# Patient Record
Sex: Female | Born: 1935 | Race: Black or African American | Hispanic: No | Smoking: Never smoker
Health system: Southern US, Community
[De-identification: ages and names within clinical notes are randomized; demographics above are authoritative.]

## PROBLEM LIST (undated history)

## (undated) DIAGNOSIS — K219 Gastro-esophageal reflux disease without esophagitis: Secondary | ICD-10-CM

## (undated) DIAGNOSIS — E785 Hyperlipidemia, unspecified: Secondary | ICD-10-CM

## (undated) DIAGNOSIS — I82409 Acute embolism and thrombosis of unspecified deep veins of unspecified lower extremity: Secondary | ICD-10-CM

## (undated) DIAGNOSIS — D649 Anemia, unspecified: Secondary | ICD-10-CM

## (undated) DIAGNOSIS — R7989 Other specified abnormal findings of blood chemistry: Secondary | ICD-10-CM

## (undated) DIAGNOSIS — F0390 Unspecified dementia without behavioral disturbance: Secondary | ICD-10-CM

## (undated) DIAGNOSIS — F329 Major depressive disorder, single episode, unspecified: Secondary | ICD-10-CM

## (undated) DIAGNOSIS — I10 Essential (primary) hypertension: Secondary | ICD-10-CM

## (undated) DIAGNOSIS — I2510 Atherosclerotic heart disease of native coronary artery without angina pectoris: Secondary | ICD-10-CM

## (undated) DIAGNOSIS — F32A Depression, unspecified: Secondary | ICD-10-CM

## (undated) DIAGNOSIS — Z86718 Personal history of other venous thrombosis and embolism: Secondary | ICD-10-CM

## (undated) HISTORY — DX: Personal history of other venous thrombosis and embolism: Z86.718

## (undated) HISTORY — DX: Other specified abnormal findings of blood chemistry: R79.89

## (undated) HISTORY — DX: Anemia, unspecified: D64.9

## (undated) HISTORY — PX: OTHER SURGICAL HISTORY: SHX169

## (undated) HISTORY — PX: EYE SURGERY: SHX253

---

## 2000-02-05 ENCOUNTER — Ambulatory Visit (HOSPITAL_COMMUNITY): Admission: RE | Admit: 2000-02-05 | Discharge: 2000-02-05 | Payer: Self-pay | Admitting: General Practice

## 2000-02-05 ENCOUNTER — Encounter: Payer: Self-pay | Admitting: General Practice

## 2000-05-20 ENCOUNTER — Other Ambulatory Visit: Admission: RE | Admit: 2000-05-20 | Discharge: 2000-05-20 | Payer: Self-pay | Admitting: Obstetrics

## 2000-05-29 ENCOUNTER — Emergency Department (HOSPITAL_COMMUNITY): Admission: EM | Admit: 2000-05-29 | Discharge: 2000-05-29 | Payer: Self-pay | Admitting: Emergency Medicine

## 2000-05-29 ENCOUNTER — Encounter: Payer: Self-pay | Admitting: Emergency Medicine

## 2000-06-07 ENCOUNTER — Ambulatory Visit (HOSPITAL_COMMUNITY): Admission: RE | Admit: 2000-06-07 | Discharge: 2000-06-07 | Payer: Self-pay | Admitting: *Deleted

## 2000-06-18 ENCOUNTER — Ambulatory Visit (HOSPITAL_COMMUNITY): Admission: RE | Admit: 2000-06-18 | Discharge: 2000-06-18 | Payer: Self-pay | Admitting: *Deleted

## 2001-04-03 ENCOUNTER — Ambulatory Visit (HOSPITAL_COMMUNITY): Admission: RE | Admit: 2001-04-03 | Discharge: 2001-04-03 | Payer: Self-pay | Admitting: *Deleted

## 2002-01-19 ENCOUNTER — Ambulatory Visit (HOSPITAL_COMMUNITY): Admission: RE | Admit: 2002-01-19 | Discharge: 2002-01-19 | Payer: Self-pay | Admitting: *Deleted

## 2002-04-01 ENCOUNTER — Inpatient Hospital Stay (HOSPITAL_COMMUNITY): Admission: EM | Admit: 2002-04-01 | Discharge: 2002-04-06 | Payer: Self-pay | Admitting: Emergency Medicine

## 2003-01-04 ENCOUNTER — Emergency Department (HOSPITAL_COMMUNITY): Admission: EM | Admit: 2003-01-04 | Discharge: 2003-01-04 | Payer: Self-pay | Admitting: Emergency Medicine

## 2003-01-12 ENCOUNTER — Inpatient Hospital Stay (HOSPITAL_COMMUNITY): Admission: EM | Admit: 2003-01-12 | Discharge: 2003-01-18 | Payer: Self-pay | Admitting: Emergency Medicine

## 2003-01-13 ENCOUNTER — Encounter: Payer: Self-pay | Admitting: Cardiology

## 2003-01-21 ENCOUNTER — Ambulatory Visit (HOSPITAL_COMMUNITY): Admission: RE | Admit: 2003-01-21 | Discharge: 2003-01-21 | Payer: Self-pay | Admitting: Internal Medicine

## 2003-04-30 ENCOUNTER — Ambulatory Visit (HOSPITAL_COMMUNITY): Admission: RE | Admit: 2003-04-30 | Discharge: 2003-04-30 | Payer: Self-pay | Admitting: Internal Medicine

## 2003-05-23 ENCOUNTER — Emergency Department (HOSPITAL_COMMUNITY): Admission: EM | Admit: 2003-05-23 | Discharge: 2003-05-23 | Payer: Self-pay | Admitting: Emergency Medicine

## 2003-05-27 ENCOUNTER — Ambulatory Visit (HOSPITAL_COMMUNITY): Admission: RE | Admit: 2003-05-27 | Discharge: 2003-05-27 | Payer: Self-pay | Admitting: Internal Medicine

## 2005-08-22 IMAGING — CR DG HIP COMPLETE 2+V*R*
3 series · 3 of 3 positions shown · non-contrast
Comparison: none

CLINICAL DATA: Right shoulder pain.  Right hip pain.  
 RIGHT SHOULDER (THREE VIEWS) 04/30/03
 No acute bony abnormality.  Specifically no evidence of fracture, subluxation or dislocation.  The right acromioclavicular joint appears mildly widened.  If there is clinical concern for AC joint separation, dedicated views of both AC joints with and without weighted would be recommended.  
 IMPRESSION
 Mild widening of the right AC joint.  If there is clinical concern for AC joint separation, recommend with and without weight views of both shoulders.  No fracture.  
 RIGHT HIP (THREE VIEWS) 04/30/03
 Two views of the right hip and AP view of the pelvis were performed.  
 No acute bony abnormality.  Specifically no evidence of fracture, subluxation or dislocation.  No bony pelvic abnormality is seen. 
 No acute bony abnormality.

[view not recorded (1 of 3)]
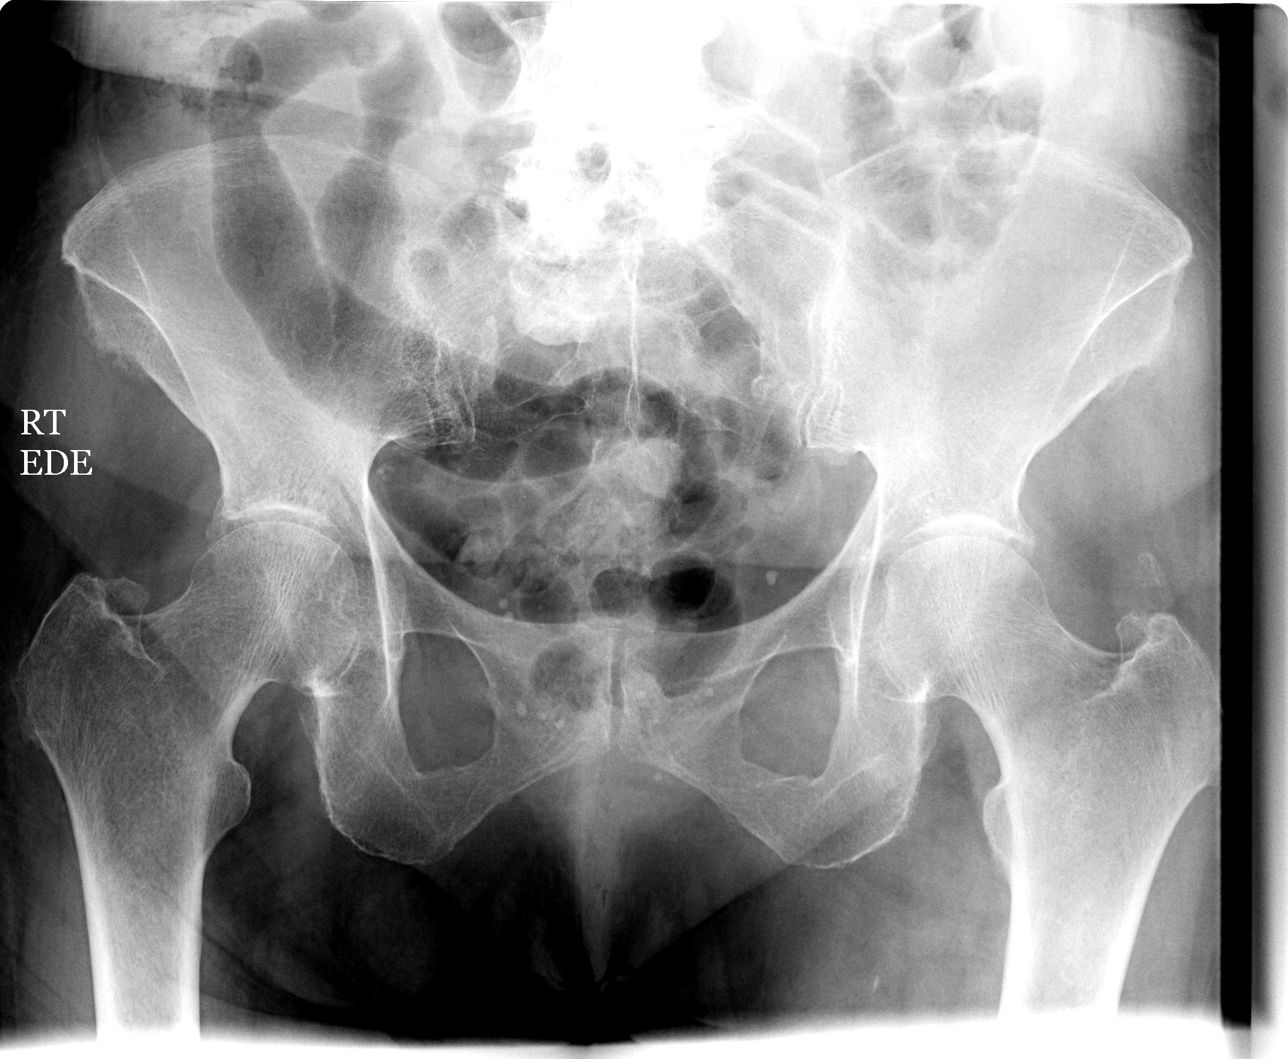

[view not recorded (2 of 3)]
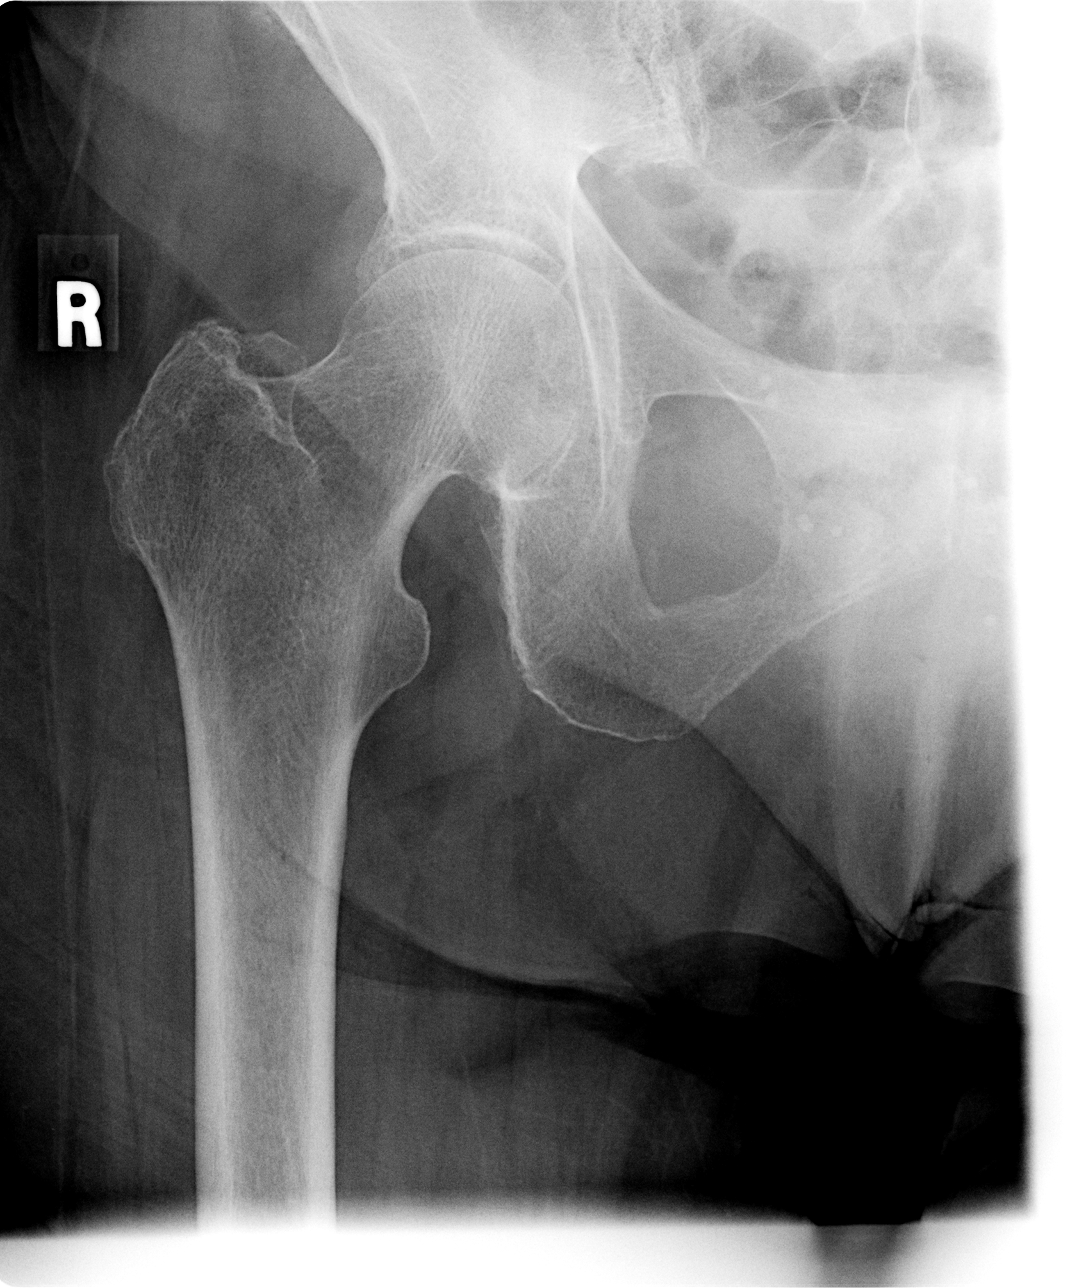

[view not recorded (3 of 3)]
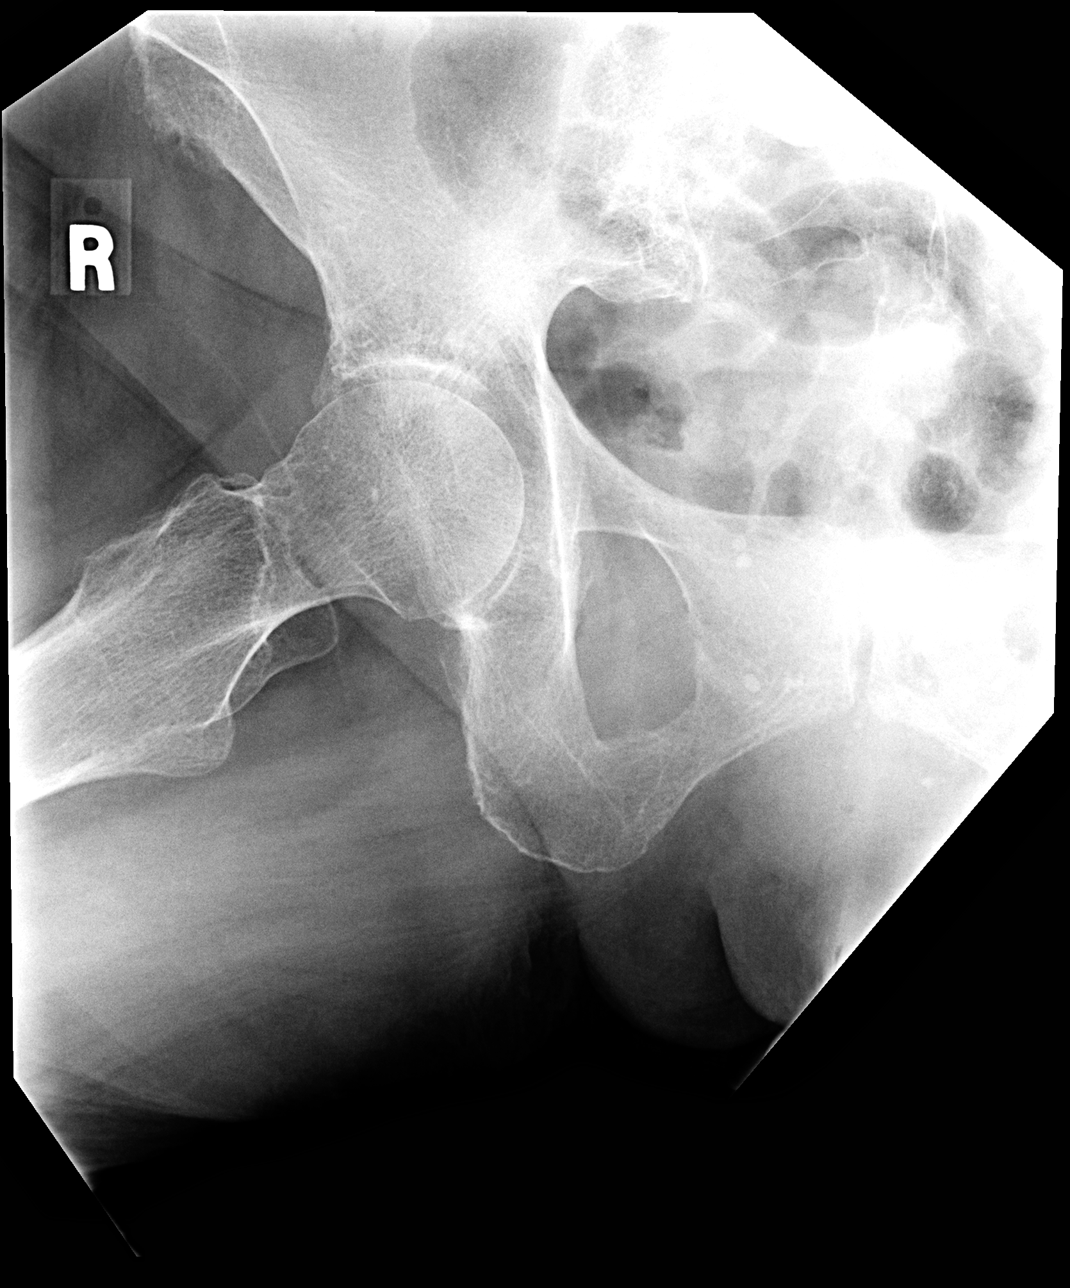

[3 of 3 positions shown; findings below may reference images not displayed]

## 2005-08-22 IMAGING — CR DG SHOULDER 2+V*R*
3 series · 3 of 3 positions shown · non-contrast
Comparison: none

CLINICAL DATA: Right shoulder pain.  Right hip pain.  
 RIGHT SHOULDER (THREE VIEWS) 04/30/03
 No acute bony abnormality.  Specifically no evidence of fracture, subluxation or dislocation.  The right acromioclavicular joint appears mildly widened.  If there is clinical concern for AC joint separation, dedicated views of both AC joints with and without weighted would be recommended.  
 IMPRESSION
 Mild widening of the right AC joint.  If there is clinical concern for AC joint separation, recommend with and without weight views of both shoulders.  No fracture.  
 RIGHT HIP (THREE VIEWS) 04/30/03
 Two views of the right hip and AP view of the pelvis were performed.  
 No acute bony abnormality.  Specifically no evidence of fracture, subluxation or dislocation.  No bony pelvic abnormality is seen. 
 No acute bony abnormality.

[view not recorded (1 of 3)]
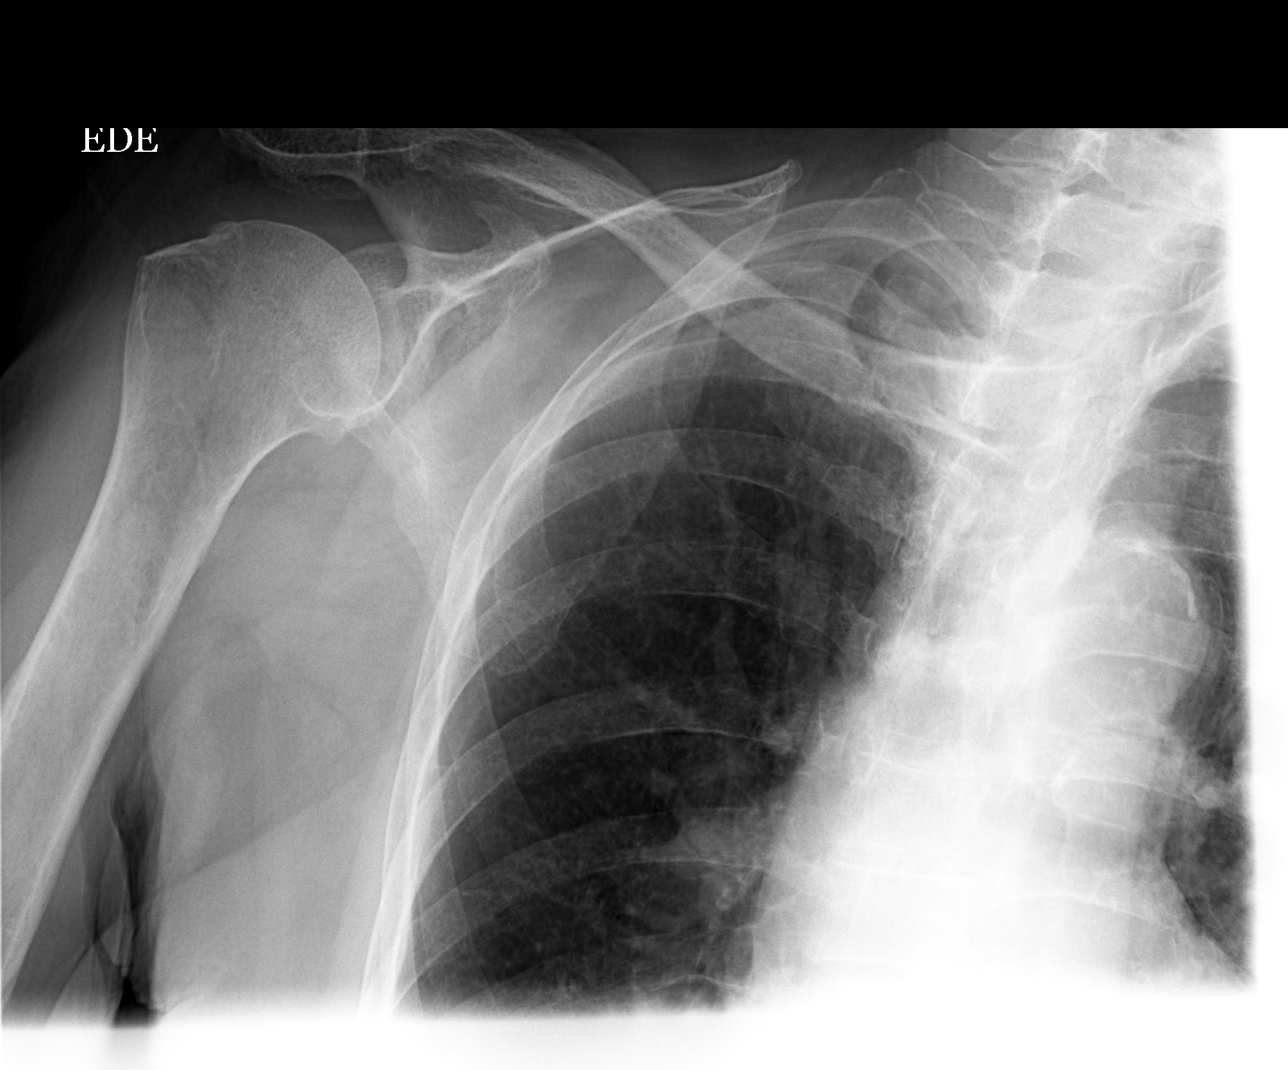

[view not recorded (2 of 3)]
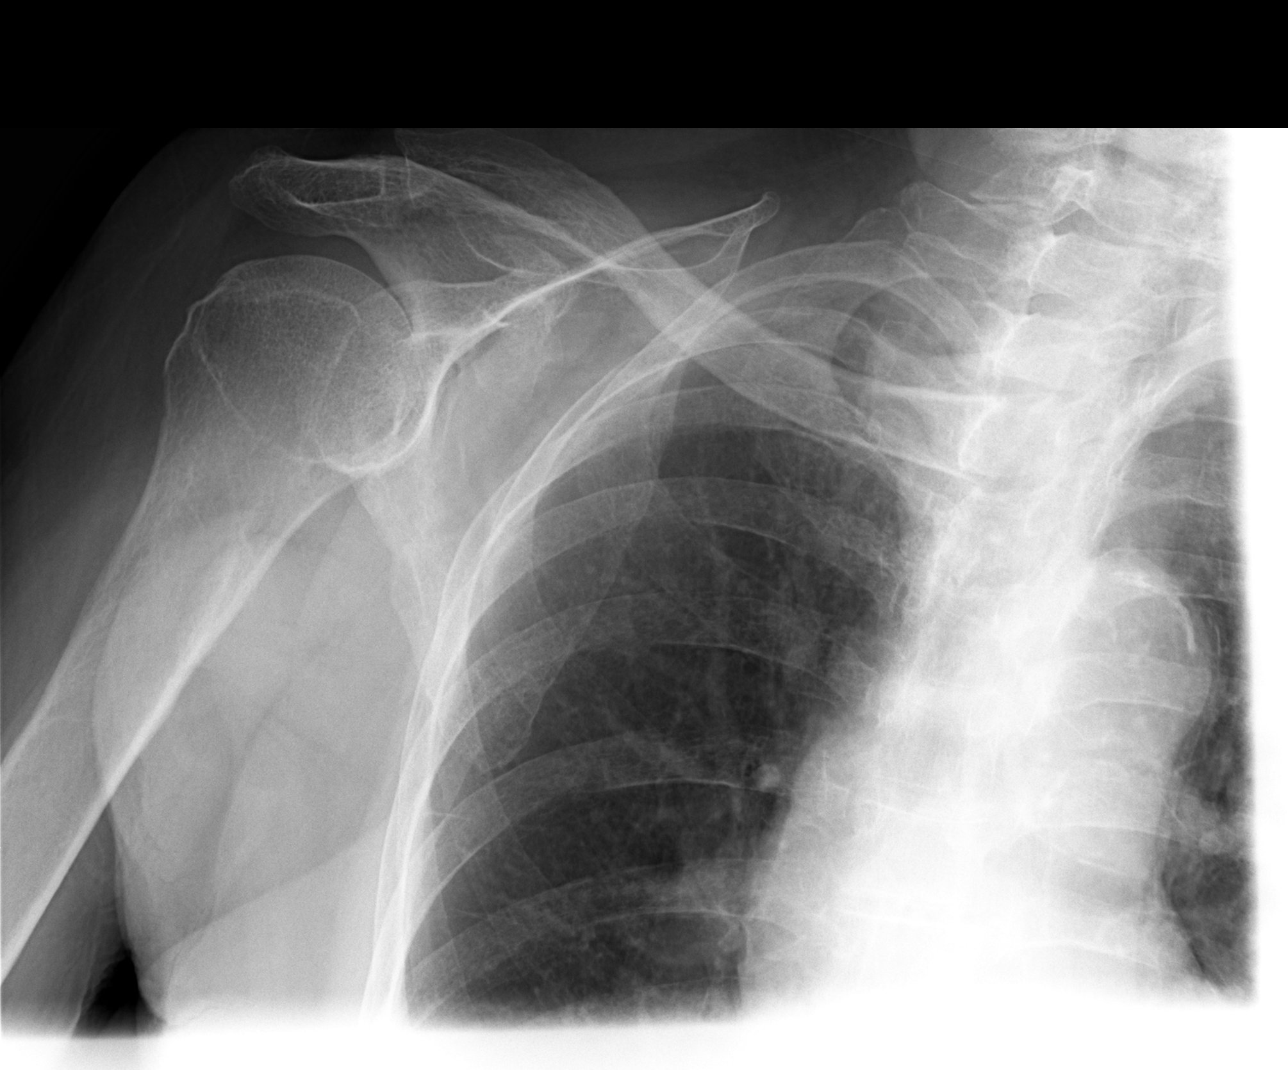

[view not recorded (3 of 3)]
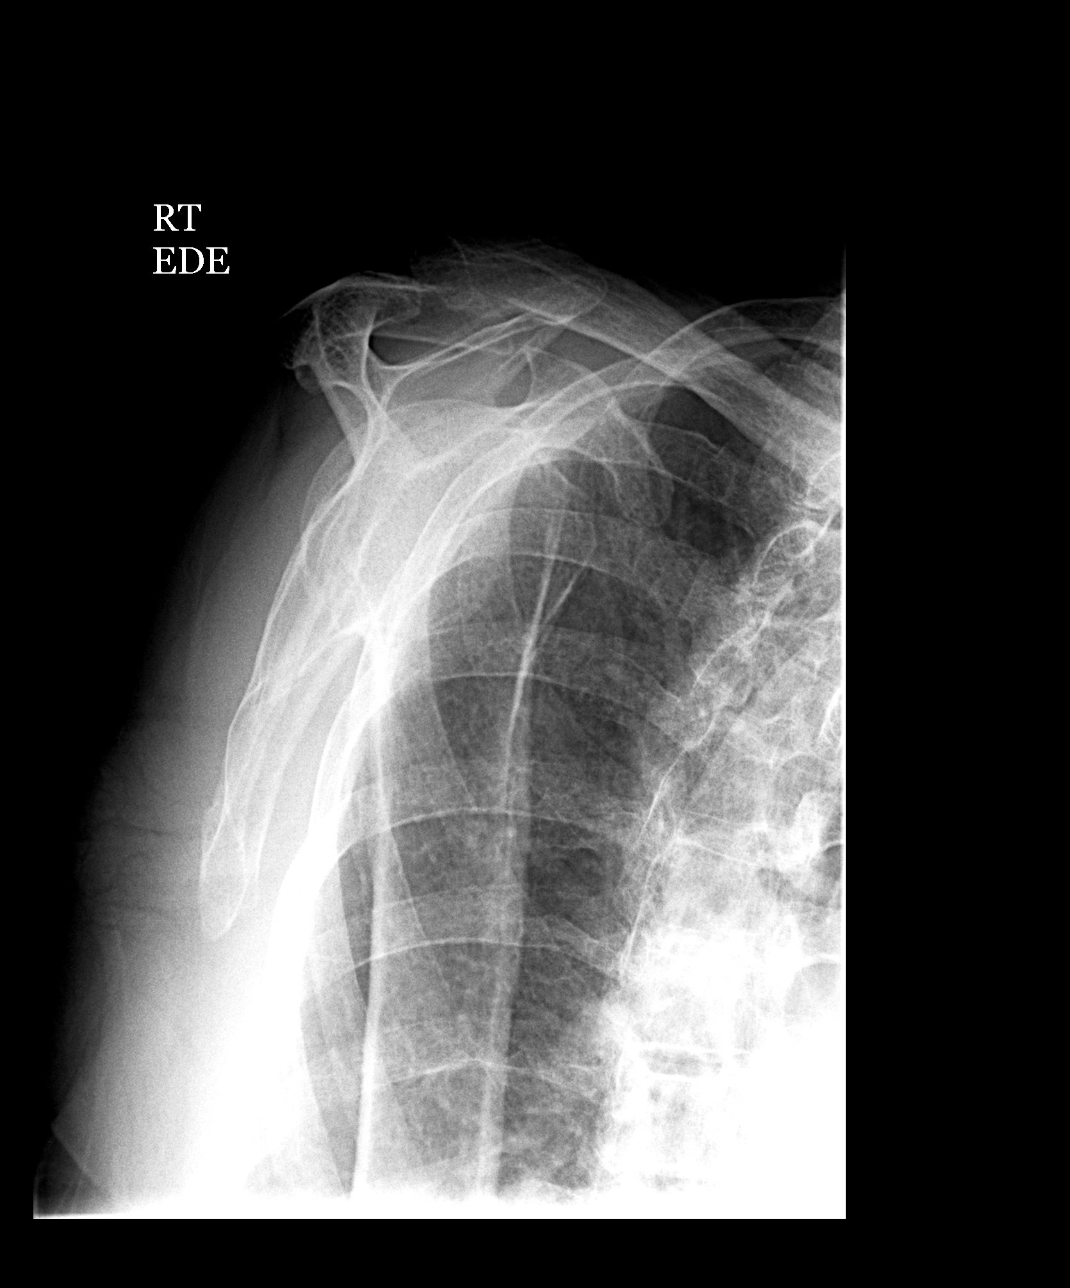

[3 of 3 positions shown; findings below may reference images not displayed]

## 2008-09-24 ENCOUNTER — Ambulatory Visit: Payer: Self-pay | Admitting: Vascular Surgery

## 2008-09-24 ENCOUNTER — Ambulatory Visit: Admission: RE | Admit: 2008-09-24 | Discharge: 2008-09-24 | Payer: Self-pay | Admitting: Internal Medicine

## 2008-12-04 ENCOUNTER — Emergency Department (HOSPITAL_COMMUNITY): Admission: EM | Admit: 2008-12-04 | Discharge: 2008-12-04 | Payer: Self-pay | Admitting: Emergency Medicine

## 2010-02-03 ENCOUNTER — Emergency Department (HOSPITAL_COMMUNITY)
Admission: EM | Admit: 2010-02-03 | Discharge: 2010-02-03 | Payer: Self-pay | Source: Home / Self Care | Admitting: Emergency Medicine

## 2010-04-27 ENCOUNTER — Ambulatory Visit (HOSPITAL_COMMUNITY)
Admission: RE | Admit: 2010-04-27 | Discharge: 2010-04-27 | Disposition: A | Discharge status: Home / Self Care | Payer: Medicare Other | Source: Ambulatory Visit | Attending: Internal Medicine | Admitting: Internal Medicine

## 2010-04-27 DIAGNOSIS — F0390 Unspecified dementia without behavioral disturbance: Secondary | ICD-10-CM | POA: Insufficient documentation

## 2010-04-27 DIAGNOSIS — F411 Generalized anxiety disorder: Secondary | ICD-10-CM | POA: Insufficient documentation

## 2010-04-27 DIAGNOSIS — I10 Essential (primary) hypertension: Secondary | ICD-10-CM | POA: Insufficient documentation

## 2010-04-27 DIAGNOSIS — F329 Major depressive disorder, single episode, unspecified: Secondary | ICD-10-CM | POA: Insufficient documentation

## 2010-04-27 DIAGNOSIS — F3289 Other specified depressive episodes: Secondary | ICD-10-CM | POA: Insufficient documentation

## 2010-04-27 DIAGNOSIS — R404 Transient alteration of awareness: Secondary | ICD-10-CM | POA: Insufficient documentation

## 2010-04-28 NOTE — Consult Note (Addendum)
  NAME:  Katie Gibson, Katie Gibson            ACCOUNT NO.:  [id number]  MEDICAL RECORD NO.:  [id number]           PATIENT TYPE:  O  LOCATION:  EE                           FACILITY:  [facility]  PHYSICIAN:  Levie Heritage, MD       DATE OF BIRTH:  Sep 14, 1910  DATE OF CONSULTATION: DATE OF DISCHARGE:  04/27/2010                                CONSULTATION   HISTORY:  The patient is a 77 year old woman with episodes of syncope versus seizure.  She had shower and after that went slim, dropped her head, went unresponsive briefly but became back right away quickly to full alertness.  She had recent fall in last 4 weeks hitting her head, requiring stitches, and is also known to have dementia, depression, anxiety, hypertension.  MEDICATIONS:  She is on Ativan, Aricept, Namenda, Celexa, amlodipine, aspirin, atorvastatin, Vicodin.  His EEG duration is 22 minutes of EEG recording.  EEG DESCRIPTION:  This is a routine 18-channel adult EEG recording.  One channel devoted to limited EKG recording.  Activation procedure was performed using the photic stimulation and the study was performed during the wakeful state.  As the EEG opens up, I noticed a posterior dominant rhythm to be well performed 8-9 Hz frequency with an amplitude of 12-20 microvolts.  Symmetrical driving was noted with the photic stimulation in posterior leads.  No sleep pattern was identified during this short study and some movement artifacts were noted.  No seizure or epileptiform discharges are recorded during this study either.  EEG INTERPRETATION:  This is a normal awake EEG.  CLINICAL NOTE:  Normal EEG does not rule out the possibility of having seizure disorder.  Therefore, clinical correlation is advised.          ______________________________ Levie Heritage, MD     WS/MEDQ  D:  04/27/2010  T:  04/28/2010  Job:  [id number]  Electronically Signed by Levie Heritage MD on 04/28/2010 12:12:27 PM

## 2010-07-07 NOTE — Discharge Summary (Signed)
   NAME:  Katie Gibson, Katie Gibson                      ACCOUNT NO.:  [id number]   MEDICAL RECORD NO.:  [id number]                   PATIENT TYPE:  INP   LOCATION:  0353                                 FACILITY:  [facility]   PHYSICIAN:  Tanya D. Daphine Deutscher, M.D.               DATE OF BIRTH:  Feb 16, 1911   DATE OF ADMISSION:  04/01/2002  DATE OF DISCHARGE:  04/06/2002                                 DISCHARGE SUMMARY   ADMISSION DIAGNOSIS:  Venous thrombosis.   SECONDARY DIAGNOSES:  1. Hypertension.  2. Increased cholesterol.  3. Personal history of deep vein thromboses.   CONSULTATIONS:  None.   HOSPITAL COURSE:  Briefly, this is an 77 year old female who presented with  swelling of her left leg.  The patient with a history of deep vein  thromboses, and has been on Coumadin, but was subtherapeutic at time of  admission.  The patient was admitted, reanticoagulated, seemed to do well,  and was discharged to home in good condition with appropriate medications,  i.e., Coumadin, as well as resuming her home medications of Norvasc,  Lopressor, Prevacid, and Vioxx.   FOLLOWUP:  The patient was instructed to follow up with Dr. Tad Moore in three  to four days for a followup PT/INR.                                               Tanya D. Daphine Deutscher, M.D.    TDM/MEDQ  D:  05/30/2002  T:  05/30/2002  Job:  [id number]

## 2010-07-07 NOTE — Discharge Summary (Signed)
NAME:  Katie Gibson, Katie Gibson                      ACCOUNT NO.:  [id number]   MEDICAL RECORD NO.:  [id number]                   PATIENT TYPE:  INP   LOCATION:  0347                                 FACILITY:   PHYSICIAN:  Elliot Cousin, M.D.                 DATE OF BIRTH:  20-Nov-1910   DATE OF ADMISSION:  01/11/2003  DATE OF DISCHARGE:  01/18/2003                                 DISCHARGE SUMMARY   PRIMARY DISCHARGE DIAGNOSES:  1. Chest pain, myocardial infarction ruled out.  VQ scan negative for     pulmonary embolus.  2. Abdominal pain/dyspepsia.  3. Hypertension.  4. Hyperlipidemia.  5. History of bilateral deep venous thrombosis on chronic Coumadin therapy.  6. Hyponatremia.  7. Vitamin B12 deficiency.  8. Alzheimer's disease with deconditioning and poor oral intake.  9. Degenerative joint disease.   DISCHARGE MEDICATIONS:  1. Protonix 40 mg daily.  2. Vitamin B12 injections 100 mcg subcutaneous every four weeks.  3. Lopressor 25 mg q.a.m. and 12.5 mg q.h.s.  4. Coumadin 1.5 mg alternating with 2 mg daily.  5. Reglan 5 mg q.a.c. and q.h.s.  6. Altace 5 mg b.i.d.  7. K-Dur 20 mEq daily.  8. Ensure 8 ounces t.i.d.  9. Sublingual nitroglycerin 0.4 mg subcutaneous p.r.n. for chest pain.  10.      Tylenol 650 mg p.o. q.4h p.r.n. pain.  11.      Vicodin 5 mg p.o. q.8h p.r.n. pain.  12.      Aricept 10 mg p.o. q.h.s. with meals.  13.      Colace 100 mg p.o. b.i.d. (hold for diarrhea).  14.      Nitroglycerin ointment 2% one inch q.12h.  15.      Norvasc 5 mg p.o. daily.  16.      Zocor 20 mg p.o. q.h.s.   DISPOSITION:  Katie Gibson will be discharged today to [facility].  She is in stable and improved condition.   PROCEDURES PERFORMED:  1. CT scan of the abdomen and pelvis on January 12, 2003 revealed multiple     low density lesions in the liver, most likely cyst.  There is no evidence     of diverticulosis or diverticulitis.  The uterus and  the adnexa are     unremarkable.  No free fluid, free air or adenopathy.  2. VQ scan on January 13, 2003.  The results revealed a normal ventilation     and perfusion lung scan.  3. 2-D echocardiogram on January 13, 2003.  The results revealed overall     left ventricular systolic function with normal left ventricular ejection     was estimated at 55-65%.  There is hypokinesis of the inferior wall and     hypokinesis of the mid, distal, septal walls.  Mild to moderate thickness     of the left ventricular wall.  The left atrium  was mildly dilated.   HISTORY OF PRESENT ILLNESS:  The patient is a 77 year old African-American  woman who was brought into the emergency room with a chief complaint of  chest pain which had been present anywhere from two to several days based  upon the patient being interviewed.  She stated that there was no  provocation of her chest pain.  It was described as a sharp, burning,  cramping type pain.  It was also a pressure type pain without radiation.  It  was located in the chest and upper portion of her abdomen.  She denied any  radiation to her arms, neck or jaw.  She rated the pain as a 5/10 to 10/10  in intensity.  However, it was not associated with shortness of breath,  diaphoresis, abdominal cramps or nausea.  The patient was therefore admitted  for evaluation and management of chest pain.   HOSPITAL COURSE:  1. CHEST PAIN.  The initial management included treatment for pain with     nitroglycerin paste 2% one inch to be changed every six hours.  In     addition, the patient was given baby aspirin daily although she was on     warfarin daily for her history of recurrent DVTs.  She was also treated     with Vicodin and Tylenol as needed for pain.  Cardiac enzymes were     collected q.8h x3.  A 2-D echocardiogram was also ordered for additional     assessment.  Although the patient's INR was therapeutic, a VQ scan was     also ordered for the assessment  of chest pain.   The cardiac enzymes x3 were completely normal.  The patient's EKG revealed  normal sinus rhythm, but there was evidence of a right bundle branch block  and evidence of left ventricular hypertrophy.  The 2-D echocardiogram  revealed a good ejection fraction estimated at 55-65%.  However, there was  some evidence of hypokinesis of the inferior wall and hypokinesis of the  mid, distal, septal wall.  There was also some evidence consistent with mild  to moderate left ventricular wall hypertrophy.  The VQ scan was completely  within normal limits.  The patient's chest pain eventually resolved during  the hospital course.  She is currently being treated with nitroglycerin  paste as well as as-needed Vicodin and Tylenol.  She is oxygenating in the  mid 90s on room air.  The patient probably has some aspect of cardiac  disease given the changes seen on the EKG and the results of the 2-D  echocardiogram.  For now, she will be treated medically.   1. HYPERLIPIDEMIA.  During the workup of chest pain, a fasting lipid panel     was ordered for assessment.  The results revealed a total cholesterol of     274, triglycerides 84, HDL cholesterol 56 and LDL cholesterol 201.  The     patient was therefore started on Zocor at 20 mg q.h.s.   1. HYPERTENSION.  During the hospital course, the patient's initial blood     pressures were ranging systolically in the 140s to 150s.  However, during     the hospital course, the patient's blood pressures increased slightly.     She was restarted on her home medications of metoprolol 25 mg b.i.d. and     amlodipine 5 mg daily.  However, the patient did have some bradycardia     during the hospital course.  Therefore, the  metoprolol was slightly     decreased to 25 mg in the morning and 12.5 mg in the evening.  Altace at     5 mg b.i.d. was added to the regimen.  Currently, she is on metoprolol 25    mg in the morning, 12.5 in the evening, Norvasc 5 mg  daily and Altace 5     mg b.i.d.  Her systolic blood pressures are ranging in the 160s     systolically now.  Either the Altace or the amlodipine may need to be     titrated upward for better blood pressure control.   1. ABDOMINAL PAIN/DYSPEPSIA.  The patient complained of diffuse abdominal     pain during the hospital course.  For further evaluation, a CT scan of     the abdomen and pelvis was ordered as well as assessing the patient's     pancreatic enzymes and liver function tests.  The lipase was within     normal limits at 36 and the patient's liver function tests were within     normal limits with exception of mildly elevated alkaline phosphatase at     131.  The patient's urinalysis was also assessed and found to be positive     for leukocytes.  The patient had been treated with ciprofloxacin several     days prior to admission and she was given one more day of Cipro during     the hospital course.  The CT scan of the abdomen and pelvis was     unremarkable with the exception of small areas in her liver which were     consistent with cysts.  The patient was eventually treated with Protonix     40 mg daily along with Reglan 5 mg p.o. q.a.c. and q.h.s.   1. HISTORY OF BILATERAL DVTs.  The patient's INR and PT were assessed daily     for her history of DVTs.  Pharmacy was consulted to assist with dosing.     The patient was started out initially on Lovenox, but the Lovenox was     discontinued once the patient's INR became therapeutic at 2.4.  The     patient's initial INR was subtherapeutic at 1.6.  It was not therapeutic     on admission.  However, it became therapeutic shortly thereafter.     Currently, the patient is on 2 mg per day of Coumadin.  She became     therapeutic rather quickly.  For now, the patient will be treated with     warfarin 2 mg alternating with 1.5 mg daily.  The patient will need to     have a close monitoring of her PT, INR.  Following discharge, the      patient's PATIENT  and INR should be assessed at least twice weekly.   1. HYPONATREMIA.  The patient had several days with her serum sodium ranging     between 125 and 126.  Studies were ordered for evaluation including a     urine osmolality which was low at 333, urine sodium 99 and a serum     osmolality which was low at 263.  Uric acid was within normal limits at     18.8.  The patient was given initially volume repletion with normal     saline.  However, this was discontinued and the patient was given 20 mg     of Lasix x1.  The patient's sodium eventually improved.  The  last sodium     reading was 134.   1. VITAMIN B12 DEFICIENCY.  The patient's vitamin B12 was assessed and found     to be low at 167.  The patient was therefore treated with vitamin B12     injections 100 mcg subcutaneous daily during the hospital course.  She     will need to be continued on treatment at 100 mcg q.4h and further dosing    per the nursing home physician.   1. ALZHEIMER'S DISEASE WITH DECONDITIONING AND POOR P.O. INTAKE.  The     patient has Alzheimer's dementia and was previously living relatively     independent or semi-independent in a retirement community.  However, the     patient's daughter had grave concerns regarding the mother's worsening     memory and the inability to continue her activities of daily living.     Therefore, skilled nursing facility placement was pursued.  The patient     is currently being treated with Aricept 10 mg daily.  She has been     encouraged to get out of bed and ambulate with physical therapy and the     nursing staff.  However, she tends to be resistant to this.  When she is     able to get out of bed she does very well.  She does ambulate a little     with nursing staff.  However, she appears to be deconditioned.  The     patient's oral intake has waxed and waned.  Some days she is eating     better than others.  The registered dietician recommended Ensure  three     times daily to help with her intake.  For evaluation of the patient's     Alzheimer's disease, TSH, RPR and a vitamin B12 were ordered.  The TSH     was within normal limits at 0.859, the RPR was nonreactive and the     vitamin B12 was low as stated above at 167.  The patient will continue to     need encouragement with ambulation and also encouragement with mealtime.     She will continue on Aricept 10 mg q.h.s.   1. DEGENERATIVE JOINT DISEASE.  The patient had many somatic complaints     regarding back pain, leg pain and hip pain.  She specifically complained     of right hip discomfort.  A radiograph of her right hip revealed right     hip degenerative changes, but no evidence of acute fracture or     dislocation.  The patient was treated with as-needed Tylenol and Vicodin.                                               Elliot Cousin, M.D.    DF/MEDQ  D:  01/18/2003  T:  01/18/2003  Job:  [id number]   cc:   Renaye Rakers, M.D.  [phone]  [place]  [place] [address]  Fax: [phone]

## 2010-07-07 NOTE — H&P (Signed)
NAME:  Katie Gibson, Katie Gibson                      ACCOUNT NO.:  [id number]   MEDICAL RECORD NO.:  [id number]                   PATIENT TYPE:  INP   LOCATION:  0109                                 FACILITY:  [facility]   PHYSICIAN:  Ara D. Tammi Klippel, M.D.                DATE OF BIRTH:  1910-10-23   DATE OF ADMISSION:  01/11/2003  DATE OF DISCHARGE:                                HISTORY & PHYSICAL   PRIMARY CARE PHYSICIAN:  Renaye Rakers, M.D.   CHIEF COMPLAINT:  Chest pain.   HISTORY OF PRESENT ILLNESS:  The patient is a 77 year old African-American  female who is brought to the emergency room with chief complaint of chest  pain which has been present anywhere from two to several days based upon  when the patient is interviewed.  She states that there are no known  provocative or palliative factors in regards to her chest pain.  It is  qualified as a sharp, burning, cramping, and pressure type pain without any  radiation.  It is located in her chest and upper portion of her abdomen.  She denies radiation to her arms, neck, jaw, or intrascapular area.  She  states it is anywhere from a 5/10 to 10/10 and she states she has had 10/10  chest pain for the last several days.  It, however, is not associated with  shortness of breath, diaphoresis, or abdominal cramps or nausea.   ALLERGIES:  No known drug allergies.   MEDICATIONS:  1. Metoprolol 50 mg p.o. daily.  2. Amlodipine 5 mg p.o. daily.  3. Warfarin 5 mg p.o. daily.  4. ___________ 10 mg p.o. daily.  5. Ciprofloxacin 500 mg p.o. b.i.d. x1 more day.   PAST MEDICAL HISTORY:  1. Dementia.  2. Bilateral DVTs.  3. Hypertension.  4. Hyperlipidemia.   PAST SURGICAL HISTORY:  Status post bilateral cataract repair.   SOCIAL HISTORY:  The patient has no history of tobacco, alcohol, illicit or  IV drug use.  She is a retired Engineer, site.  She lives in [place] in a  retirement community with close follow-up with her daughter.   FAMILY  HISTORY:  Noncontributory.   REVIEW OF SYSTEMS:  The patient denies headache, visual acuity changes,  scotoma, auditory changes, neck pain or stiffness, shortness of breath at  rest or upon exertion, orthopnea, PND, nausea, vomiting, diarrhea, bright  red blood per rectum, melena, hematemesis, dysuria, polyuria, lower  extremity edema, numbness or weakness, fevers, chills, sweats.   PHYSICAL EXAMINATION:  VITAL SIGNS:  Temperature 97, pulse 78, respirations  24, blood pressure 154/77, pulse oximetry 99% on room air.  GENERAL:  Well-developed, well-nourished 77 year old African-American female  resting comfortably in bed, no apparent distress.  Speaking in full  sentences.  HEENT:  Head normocephalic, atraumatic without alopecia.  Eyes:  Pupils  equally round, reactive to light, post surgical.  Extraocular movements are  intact and icteric.  No injection.  No discharge.  Normal appearing  conjunctivae.  Ears:  TM clear bilaterally.  Nose:  No blood in the nares.  Mouth:  Moist mucous membranes.  Uvula midline.  Oropharynx without erythema  or exudate.  NECK:  Supple without lymphadenopathy, thyromegaly, bruit, or JVD.  LUNGS:  Clear to auscultation bilaterally without rales, rhonchi, or  wheezes.  HEART:  Regular rate and rhythm.  S1, S2.  ABDOMEN:  Obese, nondistended.  Bowel sounds present.  Nontender.  No  guarding or rebound.  No hepatosplenomegaly.  EXTREMITIES:  No clubbing, cyanosis, edema.  NEUROLOGIC:  The patient is awake, however, oriented only to name.  She  currently thinks she is on [place].  Does not know the year, month, or  date.   LABORATORY DATA:  White blood cells 8.1, H&H 14.1 and 41.2 with a platelet  count of 273,000.  PT 16.9, INR 1.6.  Sodium 128, potassium 3.8, chloride  98, carbon dioxide 23, BUN 14, creatinine 1.3, glucose 105, calcium 9.1.  LFTs are all within normal limits.  Initial cardiac enzymes are normal.  Urinalysis remarkable for small  bilirubin, large amount of white blood cells  with 11-20 white blood cells and 15 mg/dl of ketones.  Chest x-ray shows  cardiomegaly and a tortuous aorta.  Abdominal x-ray shows no obstruction or  free air.   ASSESSMENT/PLAN:  A 77 year old African-American female with Alzheimer's  disease and chest pain.  1. Neurologic/psychiatric:  There is no signs/symptoms of meningitis,     encephalitis, encephalopathy, or cerebrovascular accident.  Will continue     with her __________.  The patient appears to euthymic currently.  No     active issues.  2. Pulmonary:  No active issues.  3. Cardiovascular:  Will place the patient on telemetry.  Will cycle her     enzymes, check a fasting lipid profile in the morning and an EKG in the     morning.  The patient will get p.r.n. nitroglycerin.  Will continue her     on her beta blocker and her calcium channel blocker.  Will start the     patient on low dose aspirin.  Doubt that this is a primary cardiac event     given the prolonged history of chest pain and the negative cardiac     enzymes.  4. Renal:  No active issues.  5. Gastrointestinal:  Will get a noncontrast abdominal CT to evaluate for     abdominal pain.  May be secondary to nephrolithiasis.  6. Fluid, electrolytes, nutrition:  Will med-lock her intravenous fluids.     Unclear reason as to why her sodium has dropped 10 points over the course     of the last seven days.  Will recheck this in the morning.  When patient     is full p.o. will continue her on an HA1J diet.  7. Infectious disease:  No active issues.  8. Hematology/oncology:  Will continue the patient's Warfarin since she is     subtherapeutic.  9. Endocrine:  Will check a TSH, B12, and RPR.  10.      Prophylaxis:  She will be on Warfarin for deep venous thrombosis     prophylaxis and full p.o. for gastrointestinal prophylaxis.  11.      Disposition:  The patient will contact social work for nursing home    placement.  The  patient is a full code.  Ara D. Tammi Klippel, M.D.    ADM/MEDQ  D:  01/12/2003  T:  01/12/2003  Job:  [id number]   cc:   Renaye Rakers, M.D.  [address], [address]  [place]  [place] [address]  Fax: [phone]

## 2011-10-02 ENCOUNTER — Emergency Department (HOSPITAL_COMMUNITY): Payer: Medicare Other

## 2011-10-02 ENCOUNTER — Inpatient Hospital Stay (HOSPITAL_COMMUNITY)
Admission: EM | Admit: 2011-10-02 | Discharge: 2011-10-05 | DRG: 070 | Disposition: A | Discharge status: Skilled Nursing Facility | Payer: Medicare Other | Attending: Internal Medicine | Admitting: Internal Medicine

## 2011-10-02 ENCOUNTER — Encounter (HOSPITAL_COMMUNITY): Payer: Self-pay | Admitting: *Deleted

## 2011-10-02 DIAGNOSIS — F329 Major depressive disorder, single episode, unspecified: Secondary | ICD-10-CM | POA: Diagnosis present

## 2011-10-02 DIAGNOSIS — G9349 Other encephalopathy: Principal | ICD-10-CM | POA: Diagnosis present

## 2011-10-02 DIAGNOSIS — Z86718 Personal history of other venous thrombosis and embolism: Secondary | ICD-10-CM

## 2011-10-02 DIAGNOSIS — Z79899 Other long term (current) drug therapy: Secondary | ICD-10-CM

## 2011-10-02 DIAGNOSIS — E162 Hypoglycemia, unspecified: Secondary | ICD-10-CM | POA: Diagnosis present

## 2011-10-02 DIAGNOSIS — I2510 Atherosclerotic heart disease of native coronary artery without angina pectoris: Secondary | ICD-10-CM | POA: Diagnosis present

## 2011-10-02 DIAGNOSIS — Z7982 Long term (current) use of aspirin: Secondary | ICD-10-CM

## 2011-10-02 DIAGNOSIS — I10 Essential (primary) hypertension: Secondary | ICD-10-CM | POA: Diagnosis present

## 2011-10-02 DIAGNOSIS — E872 Acidosis, unspecified: Secondary | ICD-10-CM | POA: Diagnosis present

## 2011-10-02 DIAGNOSIS — N179 Acute kidney failure, unspecified: Secondary | ICD-10-CM | POA: Diagnosis present

## 2011-10-02 DIAGNOSIS — F3289 Other specified depressive episodes: Secondary | ICD-10-CM | POA: Diagnosis present

## 2011-10-02 DIAGNOSIS — E860 Dehydration: Secondary | ICD-10-CM | POA: Diagnosis present

## 2011-10-02 DIAGNOSIS — G9340 Encephalopathy, unspecified: Secondary | ICD-10-CM | POA: Diagnosis present

## 2011-10-02 DIAGNOSIS — E785 Hyperlipidemia, unspecified: Secondary | ICD-10-CM | POA: Diagnosis present

## 2011-10-02 DIAGNOSIS — K219 Gastro-esophageal reflux disease without esophagitis: Secondary | ICD-10-CM | POA: Diagnosis present

## 2011-10-02 DIAGNOSIS — I6350 Cerebral infarction due to unspecified occlusion or stenosis of unspecified cerebral artery: Secondary | ICD-10-CM | POA: Diagnosis present

## 2011-10-02 DIAGNOSIS — I639 Cerebral infarction, unspecified: Secondary | ICD-10-CM

## 2011-10-02 DIAGNOSIS — E538 Deficiency of other specified B group vitamins: Secondary | ICD-10-CM | POA: Diagnosis present

## 2011-10-02 DIAGNOSIS — R7989 Other specified abnormal findings of blood chemistry: Secondary | ICD-10-CM

## 2011-10-02 DIAGNOSIS — N19 Unspecified kidney failure: Secondary | ICD-10-CM

## 2011-10-02 DIAGNOSIS — F0390 Unspecified dementia without behavioral disturbance: Secondary | ICD-10-CM | POA: Diagnosis present

## 2011-10-02 HISTORY — DX: Hyperlipidemia, unspecified: E78.5

## 2011-10-02 HISTORY — DX: Essential (primary) hypertension: I10

## 2011-10-02 HISTORY — DX: Acute embolism and thrombosis of unspecified deep veins of unspecified lower extremity: I82.409

## 2011-10-02 HISTORY — DX: Gastro-esophageal reflux disease without esophagitis: K21.9

## 2011-10-02 HISTORY — DX: Depression, unspecified: F32.A

## 2011-10-02 HISTORY — DX: Major depressive disorder, single episode, unspecified: F32.9

## 2011-10-02 HISTORY — DX: Atherosclerotic heart disease of native coronary artery without angina pectoris: I25.10

## 2011-10-02 HISTORY — DX: Unspecified dementia, unspecified severity, without behavioral disturbance, psychotic disturbance, mood disturbance, and anxiety: F03.90

## 2011-10-02 HISTORY — DX: Personal history of other venous thrombosis and embolism: Z86.718

## 2011-10-02 LAB — COMPREHENSIVE METABOLIC PANEL
ALT: 12 U/L (ref 0–35)
AST: 20 U/L (ref 0–37)
Albumin: 3.7 g/dL (ref 3.5–5.2)
Chloride: 104 mEq/L (ref 96–112)
Creatinine, Ser: 1.67 mg/dL — ABNORMAL HIGH (ref 0.50–1.10)
Potassium: 4.1 mEq/L (ref 3.5–5.1)
Sodium: 142 mEq/L (ref 135–145)
Total Bilirubin: 0.4 mg/dL (ref 0.3–1.2)

## 2011-10-02 LAB — POCT I-STAT 3, ART BLOOD GAS (G3+)
TCO2: 29 mmol/L (ref 0–100)
pCO2 arterial: 52 mmHg — ABNORMAL HIGH (ref 35.0–45.0)
pH, Arterial: 7.327 — ABNORMAL LOW (ref 7.350–7.450)

## 2011-10-02 LAB — URINE MICROSCOPIC-ADD ON

## 2011-10-02 LAB — URINALYSIS, ROUTINE W REFLEX MICROSCOPIC
Glucose, UA: NEGATIVE mg/dL
Hgb urine dipstick: NEGATIVE
Protein, ur: 30 mg/dL — AB

## 2011-10-02 LAB — RAPID URINE DRUG SCREEN, HOSP PERFORMED
Cocaine: NOT DETECTED
Opiates: POSITIVE — AB

## 2011-10-02 LAB — CBC WITH DIFFERENTIAL/PLATELET
Basophils Absolute: 0 10*3/uL (ref 0.0–0.1)
Eosinophils Relative: 1 % (ref 0–5)
Lymphocytes Relative: 16 % (ref 12–46)
Monocytes Relative: 13 % — ABNORMAL HIGH (ref 3–12)
Neutrophils Relative %: 70 % (ref 43–77)
Platelets: 171 10*3/uL (ref 150–400)
RBC: 4.34 MIL/uL (ref 3.87–5.11)
RDW: 16.5 % — ABNORMAL HIGH (ref 11.5–15.5)
WBC: 10.7 10*3/uL — ABNORMAL HIGH (ref 4.0–10.5)

## 2011-10-02 LAB — URINALYSIS, MICROSCOPIC ONLY
Bilirubin Urine: NEGATIVE
Hgb urine dipstick: NEGATIVE
Specific Gravity, Urine: 1.019 (ref 1.005–1.030)
pH: 5 (ref 5.0–8.0)

## 2011-10-02 LAB — GLUCOSE, CAPILLARY: Glucose-Capillary: 65 mg/dL — ABNORMAL LOW (ref 70–99)

## 2011-10-02 MED ORDER — ONDANSETRON HCL 4 MG/2ML IJ SOLN
INTRAMUSCULAR | Status: AC
  Filled 2011-10-02: qty 2

## 2011-10-02 MED ORDER — NALOXONE HCL 0.4 MG/ML IJ SOLN
0.4000 mg | INTRAMUSCULAR | Status: DC | PRN
  Administered 2011-10-02: 0.4 mg via INTRAVENOUS
  Filled 2011-10-02: qty 1

## 2011-10-02 MED ORDER — SODIUM CHLORIDE 0.9 % IV BOLUS (SEPSIS)
500.0000 mL | Freq: Once | INTRAVENOUS | Status: AC
  Administered 2011-10-02: 500 mL via INTRAVENOUS

## 2011-10-02 MED ORDER — DEXTROSE 50 % IV SOLN
1.0000 | Freq: Once | INTRAVENOUS | Status: AC
  Administered 2011-10-02: 50 mL via INTRAVENOUS
  Filled 2011-10-02: qty 50

## 2011-10-02 MED ORDER — SODIUM CHLORIDE 0.45 % IV SOLN
INTRAVENOUS | Status: DC
  Administered 2011-10-02: 1000 mL via INTRAVENOUS

## 2011-10-02 MED ORDER — ONDANSETRON HCL 4 MG/2ML IJ SOLN
4.0000 mg | Freq: Once | INTRAMUSCULAR | Status: AC
  Administered 2011-10-02: 4 mg via INTRAVENOUS
  Filled 2011-10-02: qty 2

## 2011-10-02 NOTE — ED Notes (Signed)
Staff states "patient not as alert as normal".  Alert to verbal stimuli.

## 2011-10-02 NOTE — ED Provider Notes (Signed)
History     CSN: [id number]  Arrival date & time 10/02/11  1638   First MD Initiated Contact with Patient 10/02/11 1658      Chief Complaint  Patient presents with  . Altered Mental Status    (Consider location/radiation/quality/duration/timing/severity/associated sxs/prior treatment) Patient is a 78 y.o. female presenting with altered mental status. The history is provided by the EMS personnel and a relative (daughter).  Altered Mental Status This is a new problem. The current episode started today. The problem occurs intermittently. The problem has been unchanged. Pertinent negatives include no abdominal pain, coughing, fever or vomiting. Associated symptoms comments: Increased confusion this morning and sleeping much more than normal.. Nothing aggravates the symptoms. She has tried nothing for the symptoms.    Past Medical History  Diagnosis Date  . Dementia   . DVT (deep venous thrombosis)   . Hypertension   . Hyperlipidemia   . Depression   . GERD (gastroesophageal reflux disease)   . Coronary artery disease     Past Surgical History  Procedure Date  . Eye surgery   . Cataract extractions with lens implantation     History reviewed. No pertinent family history.  History  Substance Use Topics  . Smoking status: Unknown If Ever Smoked  . Smokeless tobacco: Not on file  . Alcohol Use: No    OB History    Grav Para Term Preterm Abortions TAB SAB Ect Mult Living                  Review of Systems  Unable to perform ROS: Dementia  Constitutional: Negative for fever.  Respiratory: Negative for cough.   Gastrointestinal: Negative for vomiting and abdominal pain.  Psychiatric/Behavioral: Positive for altered mental status.    Allergies  Review of patient's allergies indicates no known allergies.  Home Medications   Current Outpatient Rx  Name Route Sig Dispense Refill  . AMLODIPINE BESYLATE 10 MG PO TABS Oral Take 10 mg by mouth daily.    . ASPIRIN EC  81 MG PO TBEC Oral Take 81 mg by mouth daily.    Marland Kitchen BISACODYL 10 MG RE SUPP Rectal Place 10 mg rectally daily as needed. For constipation if mom does not work    . VITAMIN B 12 PO Oral Take 1,000 mg by mouth daily.    . DONEPEZIL HCL 10 MG PO TABS Oral Take 10 mg by mouth at bedtime.    Marland Kitchen HYDROCODONE-ACETAMINOPHEN 5-500 MG PO TABS Oral Take 1 tablet by mouth every 4 (four) hours as needed. For pain    . LORAZEPAM 0.5 MG PO TABS Oral Take 0.5 mg by mouth every 8 (eight) hours as needed. For agitation    . MAGNESIUM HYDROXIDE 400 MG/5ML PO SUSP Oral Take 30 mLs by mouth daily as needed. For constipation    . MEMANTINE HCL 10 MG PO TABS Oral Take 10 mg by mouth 2 (two) times daily.    Bernadette Hoit SODIUM 8.6-50 MG PO TABS Oral Take 2 tablets by mouth daily.    Marland Kitchen RA SALINE ENEMA 19-7 GM/118ML RE ENEM Rectal Place 1 enema rectally daily as needed. For constipation when mom, and bisacodyl supp does not work    . TRAMADOL HCL 50 MG PO TABS Oral Take 100 mg by mouth 2 (two) times daily. For generalized pain      BP 184/57  Pulse 65  Temp 97.1 F (36.2 C) (Axillary)  Resp 12  SpO2 100%  Physical Exam  Nursing note and vitals reviewed. Constitutional: She appears well-developed. She is cooperative. She is easily aroused. No distress.  HENT:  Head: Normocephalic and atraumatic.  Eyes: Conjunctivae are normal.  Neck: Neck supple.  Cardiovascular: Normal rate, regular rhythm and intact distal pulses.   Murmur heard.  Systolic murmur is present with a grade of 2/6  Pulses:      Radial pulses are 2+ on the right side, and 2+ on the left side.  Pulmonary/Chest: Effort normal and breath sounds normal. She has no decreased breath sounds. She has no wheezes. She has no rales.  Abdominal: Soft. She exhibits no distension. There is no tenderness.  Musculoskeletal: Normal range of motion.  Neurological: She is easily aroused.       Arouses to voice.  Follows verbal commands.  Moves all  extremities.  Skin: Skin is warm and dry. No rash noted.    ED Course  Procedures (including critical care time)  Labs Reviewed  CBC WITH DIFFERENTIAL - Abnormal; Notable for the following:    WBC 10.7 (*)  WHITE COUNT CONFIRMED ON SMEAR   RDW 16.5 (*)     Monocytes Relative 13 (*)     Monocytes Absolute 1.4 (*)     All other components within normal limits  COMPREHENSIVE METABOLIC PANEL - Abnormal; Notable for the following:    BUN 41 (*)     Creatinine, Ser 1.67 (*)     GFR calc non Af Amer 24 (*)     GFR calc Af Amer 28 (*)     All other components within normal limits  URINALYSIS, ROUTINE W REFLEX MICROSCOPIC - Abnormal; Notable for the following:    Color, Urine AMBER (*)  BIOCHEMICALS MAY BE AFFECTED BY COLOR   APPearance CLOUDY (*)     Bilirubin Urine MODERATE (*)     Ketones, ur 15 (*)     Protein, ur 30 (*)     All other components within normal limits  URINE MICROSCOPIC-ADD ON - Abnormal; Notable for the following:    Crystals CA OXALATE CRYSTALS (*)     All other components within normal limits  GLUCOSE, CAPILLARY  URINE CULTURE  URINE RAPID DRUG SCREEN (HOSP PERFORMED)  BLOOD GAS, ARTERIAL  URINALYSIS, WITH MICROSCOPIC   Ct Head Wo Contrast  10/02/2011  *RADIOLOGY REPORT*  Clinical Data: Altered mental status.  CT HEAD WITHOUT CONTRAST  Technique:  Contiguous axial images were obtained from the base of the skull through the vertex without contrast.  Comparison: 02/03/2010  Findings: Chronic sphenoid sinus disease. Atherosclerotic and physiologic intracranial calcifications.  Bilateral basal ganglia mineralization. Diffuse parenchymal atrophy. Patchy areas of hypoattenuation in deep and periventricular white matter bilaterally. Negative for acute intracranial hemorrhage, mass lesion, acute infarction, midline shift, or mass-effect. Acute infarct may be inapparent on noncontrast CT. Ventricles and sulci symmetric. Bone windows demonstrate no focal lesion.  IMPRESSION:   1. Negative for bleed or other acute intracranial process.  2. Atrophy and nonspecific white matter changes  Original Report Authenticated By: Osa Craver, M.D.   Dg Chest Portable 1 View  10/02/2011  *RADIOLOGY REPORT*  Clinical Data: Altered mental status.  PORTABLE CHEST - 1 VIEW  Comparison: None.  Findings: The patient is rotated on the study.  There is cardiomegaly.  Lungs are clear.  No pneumothorax or pleural fluid.  IMPRESSION: Cardiomegaly without acute disease.  Original Report Authenticated By: Bernadene Bell. D'ALESSIO, M.D.     1. Dehydration  MDM  78 yo female with PMHx of dementia, HTN, HLD, CAD who presents from her nursing facility for altered mental status.  Per pt's daughter who was with the pt when decision was made to bring her to the hospital, she has been more confused and sleeping much more than today.  Daughter reports she has had similar sx in the past when she has had a UTI.  No history of fever, cough, vomiting, or diarrhea and pt has not complained of any pain.  On arrival to the ED pt sleeping, but easily aroused to voice.  Follows verbal commands.  Answers yes/no questions and denies pain.  AF, hypertensive with vital signs otherwise stable.  No focal findings on exam.  Will get labs, CXR, and urinalysis.  CXR w/o acute abnormality.  UA negative for UTI, but elevated ketones consistent with dehydration.  Pt also has Cr of 1.67 with unknown baseline.  CBC wnl w/o leukocytosis or anemia.  Head CT was obtained that was also negative.  Giving fluids for dehydration.  Will admit to Hospitalist service for continued rehydration and further work-up for altered mental status.        Cherre Robins, MD 10/02/11 2326

## 2011-10-02 NOTE — H&P (Signed)
Katie Gibson is an 78 y.o. female. Patient seen and examined on October 02, 2011 at 11 PM. PCP - Dr. Chilton Si at [facility].   Chief Complaint: Unresponsive. HPI: 78 year old female with history of hypertension, dementia, previous history of DVT, B12 deficiency was found to be suddenly unresponsive at around 6 PM this evening. Patient's daughter states she had gone to visit the patient. Patient was sitting at the nursing station at the nursing home when patient suddenly became unresponsive. She was quickly taken to her room but was hardly responsive. She was brought to the ER and CT did not show any acute. Patient is afebrile. Chest x-ray and urinalysis are unremarkable. Patient on exam had been point pupils. Narcan 0.4 mg IV was given one dose with no significant response. Repeat CBG at this time while dictating was 60. I have ordered one ampule of D50. Patient will be admitted for further management. As per patient's daughter patient is usually ambulatory with help of a walker. She feeds herself. She over the last few days has not been eating well and the nursing home was thinking she may have developed a UTI as patient usually has change in behavior and she gets a UTI. She also had a fall 3 days ago but did not lose consciousness. Patient did not complain of any chest pain or shortness of breath.  Past Medical History  Diagnosis Date  . Dementia   . DVT (deep venous thrombosis)   . Hypertension   . Hyperlipidemia   . Depression   . GERD (gastroesophageal reflux disease)   . Coronary artery disease     Past Surgical History  Procedure Date  . Eye surgery   . Cataract extractions with lens implantation     Family History  Problem Relation Age of Onset  . Stroke Daughter    Social History:  reports that she has never smoked. She does not have any smokeless tobacco history on file. She reports that she does not drink alcohol or use illicit drugs.  Allergies: No Known  Allergies   (Not in a hospital admission)  Results for orders placed during the hospital encounter of 10/02/11 (from the past 48 hour(s))  CBC WITH DIFFERENTIAL     Status: Abnormal   Collection Time   10/02/11  5:36 PM      Component Value Range Comment   WBC 10.7 (*) 4.0 - 10.5 K/uL WHITE COUNT CONFIRMED ON SMEAR   RBC 4.34  3.87 - 5.11 MIL/uL    Hemoglobin 12.8  12.0 - 15.0 g/dL    HCT 95.6  21.3 - 08.6 %    MCV 88.7  78.0 - 100.0 fL    MCH 29.5  26.0 - 34.0 pg    MCHC 33.2  30.0 - 36.0 g/dL    RDW 57.8 (*) 46.9 - 15.5 %    Platelets 171  150 - 400 K/uL PLATELET COUNT CONFIRMED BY SMEAR   Neutrophils Relative 70  43 - 77 %    Lymphocytes Relative 16  12 - 46 %    Monocytes Relative 13 (*) 3 - 12 %    Eosinophils Relative 1  0 - 5 %    Basophils Relative 0  0 - 1 %    Neutro Abs 7.5  1.7 - 7.7 K/uL    Lymphs Abs 1.7  0.7 - 4.0 K/uL    Monocytes Absolute 1.4 (*) 0.1 - 1.0 K/uL    Eosinophils Absolute 0.1  0.0 -  0.7 K/uL    Basophils Absolute 0.0  0.0 - 0.1 K/uL    Smear Review MORPHOLOGY UNREMARKABLE     COMPREHENSIVE METABOLIC PANEL     Status: Abnormal   Collection Time   10/02/11  5:36 PM      Component Value Range Comment   Sodium 142  135 - 145 mEq/L    Potassium 4.1  3.5 - 5.1 mEq/L    Chloride 104  96 - 112 mEq/L    CO2 24  19 - 32 mEq/L    Glucose, Bld 90  70 - 99 mg/dL    BUN 41 (*) 6 - 23 mg/dL    Creatinine, Ser 1.61 (*) 0.50 - 1.10 mg/dL    Calcium 9.4  8.4 - 09.6 mg/dL    Total Protein 7.0  6.0 - 8.3 g/dL    Albumin 3.7  3.5 - 5.2 g/dL    AST 20  0 - 37 U/L    ALT 12  0 - 35 U/L    Alkaline Phosphatase 88  39 - 117 U/L    Total Bilirubin 0.4  0.3 - 1.2 mg/dL    GFR calc non Af Amer 24 (*) >90 mL/min    GFR calc Af Amer 28 (*) >90 mL/min   GLUCOSE, CAPILLARY     Status: Normal   Collection Time   10/02/11  5:43 PM      Component Value Range Comment   Glucose-Capillary 86  70 - 99 mg/dL   URINALYSIS, ROUTINE W REFLEX MICROSCOPIC     Status: Abnormal    Collection Time   10/02/11  6:05 PM      Component Value Range Comment   Color, Urine AMBER (*) YELLOW BIOCHEMICALS MAY BE AFFECTED BY COLOR   APPearance CLOUDY (*) CLEAR    Specific Gravity, Urine 1.024  1.005 - 1.030    pH 5.0  5.0 - 8.0    Glucose, UA NEGATIVE  NEGATIVE mg/dL    Hgb urine dipstick NEGATIVE  NEGATIVE    Bilirubin Urine MODERATE (*) NEGATIVE    Ketones, ur 15 (*) NEGATIVE mg/dL    Protein, ur 30 (*) NEGATIVE mg/dL    Urobilinogen, UA 1.0  0.0 - 1.0 mg/dL    Nitrite NEGATIVE  NEGATIVE    Leukocytes, UA NEGATIVE  NEGATIVE   URINE MICROSCOPIC-ADD ON     Status: Abnormal   Collection Time   10/02/11  6:05 PM      Component Value Range Comment   Squamous Epithelial / LPF RARE  RARE    WBC, UA 0-2  <3 WBC/hpf    RBC / HPF 0-2  <3 RBC/hpf    Bacteria, UA RARE  RARE    Casts MANY HYALINE CASTS AND RARE GRANULAR CASTS  NEGATIVE    Crystals CA OXALATE CRYSTALS (*) NEGATIVE    Urine-Other AMORPHOUS URATES/PHOSPHATES      Ct Head Wo Contrast  10/02/2011  *RADIOLOGY REPORT*  Clinical Data: Altered mental status.  CT HEAD WITHOUT CONTRAST  Technique:  Contiguous axial images were obtained from the base of the skull through the vertex without contrast.  Comparison: 02/03/2010  Findings: Chronic sphenoid sinus disease. Atherosclerotic and physiologic intracranial calcifications.  Bilateral basal ganglia mineralization. Diffuse parenchymal atrophy. Patchy areas of hypoattenuation in deep and periventricular white matter bilaterally. Negative for acute intracranial hemorrhage, mass lesion, acute infarction, midline shift, or mass-effect. Acute infarct may be inapparent on noncontrast CT. Ventricles and sulci symmetric. Bone windows demonstrate no focal  lesion.  IMPRESSION:  1. Negative for bleed or other acute intracranial process.  2. Atrophy and nonspecific white matter changes  Original Report Authenticated By: Osa Craver, M.D.   Dg Chest Portable 1 View  10/02/2011   *RADIOLOGY REPORT*  Clinical Data: Altered mental status.  PORTABLE CHEST - 1 VIEW  Comparison: None.  Findings: The patient is rotated on the study.  There is cardiomegaly.  Lungs are clear.  No pneumothorax or pleural fluid.  IMPRESSION: Cardiomegaly without acute disease.  Original Report Authenticated By: Bernadene Bell. Maricela Curet, M.D.    Review of Systems  Constitutional: Negative.   HENT: Negative.   Eyes: Negative.   Respiratory: Negative.   Cardiovascular: Negative.   Gastrointestinal: Negative.   Genitourinary: Negative.   Musculoskeletal: Negative.   Skin: Negative.   Neurological:       Unresponsive.  Endo/Heme/Allergies: Negative.     Blood pressure 184/57, pulse 65, temperature 97.1 F (36.2 C), temperature source Axillary, resp. rate 12, SpO2 100.00%. Physical Exam  Constitutional: She appears well-developed and well-nourished. No distress.  HENT:  Head: Normocephalic and atraumatic.  Right Ear: External ear normal.  Left Ear: External ear normal.  Eyes: Right eye exhibits no discharge. Left eye exhibits no discharge. No scleral icterus.       Pin point pupil bilaterally.  Neck: Normal range of motion. Neck supple.  Cardiovascular: Normal rate and regular rhythm.   Respiratory: Effort normal and breath sounds normal. No respiratory distress. She has no wheezes. She has no rales.  GI: Soft. Bowel sounds are normal. She exhibits no distension. There is no tenderness. There is no rebound.  Neurological:       Drowsy hardly responding to any stimuli. Patient did try resist when I tried to open her eyes.  Skin: Skin is warm and dry. She is not diaphoretic.     Assessment/Plan #1. Acute encephalopathy - cause is not clear. At this time patient is hypoglycemic for which I have ordered D50 one ampule. We'll closely follow CBGs. Check MRI of the brain to make sure there is no stroke. Gently hydrate as patient's creatinine is 1.6 and we don't have a baseline creatinine available  at this time. Patient is afebrile at this time. Check ammonia level, ABG and drug screen. Further recommendations based on clinical course and test ordered. #2. Hypertension - patient will be n.p.o. until patient is more alert and until then patient will be on when necessary IV hydralazine for systolic blood pressure more than 180. #3. Dementia - we will continue her home medications when patient is more alert awake. #4. Previous history of DVTs. #5. B12 deficiency.  CODE STATUS - I had a detailed discussion with patient's daughter Ms.Deveaux Winterstar over the phone. At this time patient's daughter confirmed the patient's a full code. May need to readdress CODE STATUS in a.m. if patient does not make much progress.   Eduard Clos 10/02/2011, 11:37 PM

## 2011-10-02 NOTE — ED Notes (Signed)
Daughter, [facility] POA  Racine Deveaux: [phone], [phone]

## 2011-10-02 NOTE — ED Notes (Signed)
Daughter, Cydni Reddoch, would like to be called with updates and if pt is dicharged/admitted. [phone]

## 2011-10-03 ENCOUNTER — Inpatient Hospital Stay (HOSPITAL_COMMUNITY): Payer: Medicare Other

## 2011-10-03 DIAGNOSIS — E162 Hypoglycemia, unspecified: Secondary | ICD-10-CM

## 2011-10-03 DIAGNOSIS — N19 Unspecified kidney failure: Secondary | ICD-10-CM

## 2011-10-03 DIAGNOSIS — I6350 Cerebral infarction due to unspecified occlusion or stenosis of unspecified cerebral artery: Secondary | ICD-10-CM

## 2011-10-03 DIAGNOSIS — R7989 Other specified abnormal findings of blood chemistry: Secondary | ICD-10-CM

## 2011-10-03 DIAGNOSIS — I639 Cerebral infarction, unspecified: Secondary | ICD-10-CM

## 2011-10-03 HISTORY — DX: Other specified abnormal findings of blood chemistry: R79.89

## 2011-10-03 LAB — URINE CULTURE
Colony Count: NO GROWTH
Culture: NO GROWTH

## 2011-10-03 LAB — GLUCOSE, CAPILLARY
Glucose-Capillary: 135 mg/dL — ABNORMAL HIGH (ref 70–99)
Glucose-Capillary: 143 mg/dL — ABNORMAL HIGH (ref 70–99)
Glucose-Capillary: 163 mg/dL — ABNORMAL HIGH (ref 70–99)
Glucose-Capillary: 61 mg/dL — ABNORMAL LOW (ref 70–99)
Glucose-Capillary: 65 mg/dL — ABNORMAL LOW (ref 70–99)
Glucose-Capillary: 66 mg/dL — ABNORMAL LOW (ref 70–99)
Glucose-Capillary: 76 mg/dL (ref 70–99)
Glucose-Capillary: 77 mg/dL (ref 70–99)

## 2011-10-03 LAB — COMPREHENSIVE METABOLIC PANEL
BUN: 30 mg/dL — ABNORMAL HIGH (ref 6–23)
CO2: 24 mEq/L (ref 19–32)
Calcium: 8.8 mg/dL (ref 8.4–10.5)
Creatinine, Ser: 0.97 mg/dL (ref 0.50–1.10)
GFR calc Af Amer: 55 mL/min — ABNORMAL LOW (ref >90)
GFR calc non Af Amer: 47 mL/min — ABNORMAL LOW (ref >90)
Glucose, Bld: 66 mg/dL — ABNORMAL LOW (ref 70–99)
Sodium: 143 mEq/L (ref 135–145)
Total Protein: 6.1 g/dL (ref 6.0–8.3)

## 2011-10-03 LAB — CARDIAC PANEL(CRET KIN+CKTOT+MB+TROPI)
CK, MB: 3.4 ng/mL (ref 0.3–4.0)
Total CK: 125 U/L (ref 7–177)

## 2011-10-03 LAB — CBC WITH DIFFERENTIAL/PLATELET
Basophils Relative: 0 % (ref 0–1)
Eosinophils Absolute: 0.1 10*3/uL (ref 0.0–0.7)
Eosinophils Relative: 1 % (ref 0–5)
HCT: 37.8 % (ref 36.0–46.0)
Hemoglobin: 12.3 g/dL (ref 12.0–15.0)
Lymphs Abs: 2 10*3/uL (ref 0.7–4.0)
MCH: 29 pg (ref 26.0–34.0)
MCHC: 32.5 g/dL (ref 30.0–36.0)
MCV: 89.2 fL (ref 78.0–100.0)
Monocytes Absolute: 0.8 10*3/uL (ref 0.1–1.0)
Monocytes Relative: 9 % (ref 3–12)
Neutrophils Relative %: 70 % (ref 43–77)

## 2011-10-03 LAB — LIPID PANEL
HDL: 74 mg/dL (ref >39)
Total CHOL/HDL Ratio: 2.9 RATIO
Triglycerides: 68 mg/dL (ref <150)

## 2011-10-03 LAB — HEMOGLOBIN A1C: Hgb A1c MFr Bld: 5.3 % (ref <5.7)

## 2011-10-03 LAB — TSH: TSH: 0.256 u[IU]/mL — ABNORMAL LOW (ref 0.350–4.500)

## 2011-10-03 MED ORDER — DEXTROSE-NACL 5-0.45 % IV SOLN
INTRAVENOUS | Status: DC
  Administered 2011-10-03: 10:00:00 via INTRAVENOUS

## 2011-10-03 MED ORDER — ASPIRIN EC 81 MG PO TBEC
81.0000 mg | DELAYED_RELEASE_TABLET | Freq: Every day | ORAL | Status: DC
  Administered 2011-10-03 – 2011-10-05 (×3): 81 mg via ORAL
  Filled 2011-10-03 (×3): qty 1

## 2011-10-03 MED ORDER — HYDRALAZINE HCL 20 MG/ML IJ SOLN
10.0000 mg | INTRAMUSCULAR | Status: DC | PRN
  Filled 2011-10-03: qty 0.5

## 2011-10-03 MED ORDER — MEMANTINE HCL 10 MG PO TABS
10.0000 mg | ORAL_TABLET | Freq: Two times a day (BID) | ORAL | Status: DC
  Administered 2011-10-03 – 2011-10-05 (×5): 10 mg via ORAL
  Filled 2011-10-03 (×8): qty 1

## 2011-10-03 MED ORDER — BIOTENE DRY MOUTH MT LIQD
15.0000 mL | Freq: Two times a day (BID) | OROMUCOSAL | Status: DC
  Administered 2011-10-03 – 2011-10-05 (×5): 15 mL via OROMUCOSAL

## 2011-10-03 MED ORDER — ENOXAPARIN SODIUM 30 MG/0.3ML ~~LOC~~ SOLN
30.0000 mg | Freq: Every day | SUBCUTANEOUS | Status: DC
  Administered 2011-10-03: 30 mg via SUBCUTANEOUS
  Filled 2011-10-03: qty 0.3

## 2011-10-03 MED ORDER — DEXTROSE 50 % IV SOLN
25.0000 mL | Freq: Once | INTRAVENOUS | Status: AC | PRN
  Administered 2011-10-03: 25 mL via INTRAVENOUS

## 2011-10-03 MED ORDER — DEXTROSE 50 % IV SOLN
INTRAVENOUS | Status: AC
  Administered 2011-10-03: 25 mL via INTRAVENOUS
  Filled 2011-10-03: qty 50

## 2011-10-03 MED ORDER — DONEPEZIL HCL 10 MG PO TABS
10.0000 mg | ORAL_TABLET | Freq: Every day | ORAL | Status: DC
  Administered 2011-10-03 – 2011-10-04 (×2): 10 mg via ORAL
  Filled 2011-10-03 (×5): qty 1

## 2011-10-03 MED ORDER — DEXTROSE 50 % IV SOLN
INTRAVENOUS | Status: AC
  Filled 2011-10-03: qty 50

## 2011-10-03 NOTE — Clinical Social Work Psychosocial (Signed)
     Clinical Social Work Department BRIEF PSYCHOSOCIAL ASSESSMENT 10/03/2011  Patient:  Katie Gibson, Katie Gibson     Account Number:  [id number]     Admit date:  10/02/2011  Clinical Social Worker:  Doree Albee  Date/Time:  10/03/2011 03:38 PM  Referred by:  RN  Date Referred:  10/03/2011 Referred for  SNF Placement   Other Referral:   Interview type:  Family Other interview type:   patient daughter    PSYCHOSOCIAL DATA Living Status:  FACILITY Admitted from facility:  [facility] Level of care:  Skilled Nursing Facility Primary support name:  nikki deveaux Primary support relationship to patient:  CHILD, ADULT Degree of support available:   strong    CURRENT CONCERNS Current Concerns  Post-Acute Placement   Other Concerns:    SOCIAL WORK ASSESSMENT / PLAN CSW spoke with pt daughter by phone as pt is only oriented to self and very lethargic per chart review. CSW introduced self and csw role. Pt daughter shraed that patient is a long term resident at [facility] and plans to return when medically stable. Per discussion with  pt daughter, pt has been a resident for several years. Pt daughter stated that she has been very happy with pt care.   Assessment/plan status:  Psychosocial Support/Ongoing Assessment of Needs Other assessment/ plan:   and discharge planning   Information/referral to community resources:   no resources identified at this time, pt plans to return to [place] where pt and pt daughter are well connected with snf community    PATIENTS/FAMILYS RESPONSE TO PLAN OF CARE: Pt daughter thanked csw for concern and support. Pt daughter is motivated to help as she can with pt dc plans and care for pt to return to [place]. However pt daughter also recovering from a stroke.

## 2011-10-03 NOTE — Progress Notes (Signed)
Stroke swallow screen attempted.  Patient arousable but too lethargic to attempt swallow screen at this time.  Patient will follow simple commands but falls back asleep.  NIH stroke scale partially done, patient not able to open eyes enough to look at pictures or read the cards.  Will attempt these again if patient wakes up more.  Continuing to monitor.

## 2011-10-03 NOTE — Evaluation (Signed)
Clinical/Bedside Swallow Evaluation Patient Details  Name: Katie Gibson MRN: [id number] Date of Birth: 10/21/1910  Today's Date: 10/03/2011 Time: 1100-1116 SLP Time Calculation (min): 16 min  Past Medical History:  Past Medical History  Diagnosis Date  . Dementia   . DVT (deep venous thrombosis)   . Hypertension   . Hyperlipidemia   . Depression   . GERD (gastroesophageal reflux disease)   . Coronary artery disease    Past Surgical History:  Past Surgical History  Procedure Date  . Eye surgery   . Cataract extractions with lens implantation    HPI:  78 year old female admitted with sudden onset unresponsiveness of unknown origin. CT head and CXR negative for acute changes.   Assessment / Plan / Recommendation Clinical Impression  Patient presents with what appears to be a functional oropharyngeal swallow. No overt indication of aspiration observed. Risk factors for aspiration include advanced age, lethargy, and decreased mentation. Recommend initiation of mechanical soft diet, thin liquids once patient able to maintain alert state independently.     Aspiration Risk  Mild    Diet Recommendation Dysphagia 3 (Mechanical Soft);Thin liquid   Liquid Administration via: Cup;Straw Medication Administration: Whole meds with liquid Supervision: Patient able to self feed;Intermittent supervision to cue for compensatory strategies Compensations: Slow rate;Small sips/bites Postural Changes and/or Swallow Maneuvers: Seated upright 90 degrees    Other  Recommendations Oral Care Recommendations: Oral care BID   Follow Up Recommendations  None    Frequency and Duration min 2x/week  1 week   Pertinent Vitals/Pain n/a    SLP Swallow Goals Patient will consume recommended diet without observed clinical signs of aspiration with: Supervision/safety Swallow Study Goal #1 - Progress: Not Met Patient will utilize recommended strategies during swallow to increase swallowing safety  with: Supervision/safety Swallow Study Goal #2 - Progress: Not met   Swallow Study    General HPI: 78 year old female admitted with sudden onset unresponsiveness of unknown origin. CT head and CXR negative for acute changes. Type of Study: Bedside swallow evaluation Diet Prior to this Study: NPO;IV Temperature Spikes Noted: No Respiratory Status: Room air History of Recent Intubation: No Behavior/Cognition: Cooperative;Pleasant mood;Lethargic (but easily aroused) Oral Cavity - Dentition: Missing dentition;Poor condition (missing upper dentition, poor condition on bottom) Self-Feeding Abilities: Able to feed self Patient Positioning: Upright in bed Baseline Vocal Quality: Clear Volitional Cough: Strong Volitional Swallow: Able to elicit    Oral/Motor/Sensory Function Overall Oral Motor/Sensory Function: Impaired Labial ROM: Reduced left Labial Symmetry: Abnormal symmetry left Labial Strength: Within Functional Limits Labial Sensation: Within Functional Limits Lingual ROM: Within Functional Limits Lingual Symmetry: Within Functional Limits Lingual Strength: Within Functional Limits Lingual Sensation: Within Functional Limits Facial ROM: Within Functional Limits Facial Symmetry: Within Functional Limits Facial Strength: Within Functional Limits Facial Sensation: Within Functional Limits Velum: Within Functional Limits Mandible: Within Functional Limits   Ice Chips Ice chips: Not tested   Thin Liquid Thin Liquid: Within functional limits Presentation: Cup;Self Fed;Straw    Nectar Thick Nectar Thick Liquid: Not tested   Honey Thick Honey Thick Liquid: Not tested   Puree Puree: Within functional limits Presentation: Spoon   Solid   GO   Katie Berling MA, CCC-SLP [phone]  Solid: Within functional limits Other Comments: for soft solids       Katie Gibson Katie Gibson 10/03/2011,11:22 AM

## 2011-10-03 NOTE — Progress Notes (Addendum)
Patient had CBG of 61 around 0430,  Rechecked after about 30 minutes confirming CBG of 65.  Hypoglycemic protocol in place.  25ml of D50 given.  Patient became more responsive instantly and was able to tell me her full name and her DOB.  CBG of 154 taken at 0600.

## 2011-10-03 NOTE — Progress Notes (Signed)
CSW attempted to assess patient, however per chart review pt very lethargic and only oriented to self. Per chart review, pt was admitted from [facility]. CSW left messages on two phone numbers for pt daughter listed in pt chart. Per RN, pt daughter was recently treated for a stroke here in hospital.   CSW will continue to follow pt to confirm pt discharge plans with pt daughter and complete psychosocial assessment.    CSW will complete fl2 and place in pt shadow chart for MD signature.    .Clinical social worker continuing to follow pt to assist with pt dc plans and further csw needs.   Catha Gosselin, Theresia Majors  [phone] .10/03/2011 1502pm

## 2011-10-03 NOTE — Progress Notes (Signed)
[facility] PROGRESS NOTE  THY GULLIKSON VHQ:[id number] DOB: 09-Feb-1911 DOA: 10/02/2011 PCP: [name], MD   Assessment/Plan: 1. Acute encephalopathy--Resolved. Etiology unclear. Quick resolution with treatment of hypoglycemia is suggestive, but MRI abnormal. Plan as below. EKG--SR, RBBB 2. Possible acute infarction/stroke--questionable on MRI. Will ask neurology opinion in AM. Continue ASA for now. Check 2-d echocardiogram, carotids, FLP, HgbA1c, PT eval. ST eval noted. 3. Mild respiratory acidosis--clinically resolved. 4. Hypoglycemia--recurrent. Etiology unclear, urine culture & CXR negative. Suspect poor oral intake. Continue d5 1/2 and monitor. 5. Dehydration--resolved. 6. Acute renal failure--resolved. 7. Low TSH--check T4, T3. No history of thyroid disease. 8. HTN--resume anti-hypertensives next 1-2 days. 9. History of DVT 10. Dementia--appears stable. Continue Namenda and Aricept.  Code Status: Full code Family Communication: Daughter, [facility]: [phone], [phone]  Disposition Plan: return to [place] when stable  Brendia Sacks, MD  [facility] Team 1 Pager [phone]. If 7PM-7AM, please contact night-coverage at [email or web address], password [facility] 10/03/2011, 11:33 AM  LOS: 1 day   Brief narrative: 78 year old female with history of dementia, previous history of DVT, found to be suddenly unresponsive at around 6 PM this evening. Narcan 0.4 mg IV was given one dose with no significant response. CBG 60. As per patient's daughter patient is usually ambulatory with help of a walker. She feeds herself. She over the last few days has not been eating well and the nursing home was thinking she may have developed a UTI as patient usually has change in behavior and she gets a UTI. She also had a fall 3 days ago but did not lose consciousness. Patient did not complain of any chest pain or shortness of breath.  Consultants:  ST--dysphagia 3, thin  liquid, whole medications with liquid  Procedures:  2-d echo--  Bilateral carotid u/s--  HPI/Subjective: Feels better, complains of whole body pain.  Objective: Filed Vitals:   10/03/11 0800 10/03/11 0900 10/03/11 1000 10/03/11 1100  BP: 170/54 142/40 158/52 150/47  Pulse: 65 62 66 64  Temp:      TempSrc:      Resp: 21 14 24 27   Height:      Weight:      SpO2: 100% 92% 94% 93%    Intake/Output Summary (Last 24 hours) at 10/03/11 1133 Last data filed at 10/03/11 1100  Gross per 24 hour  Intake 1061.25 ml  Output    930 ml  Net 131.25 ml   @weightlast3 @  Exam:   General:  Appears calm and comfortable.  Eyes: pupils pinpoint. Lids, irises appear normal.  Cardiovascular: RRR, no m/r/g. No LE edema.  Respiratory: CTA bilaterally, no w/r/r. Normal respiratory effort.  Abdomen: soft, ntnd  Psychiatric: grossly normal mood and affect, speech fluent and appropriate. Oriented to self.  Musculoskeletal: Tone and strength upper and lower extremities grossly normal  Neurologic: grossly non-focal  Data Reviewed: Basic Metabolic Panel:  Lab 10/03/11 6387 10/02/11 1736  NA 143 142  K 3.5 4.1  CL 107 104  CO2 24 24  GLUCOSE 66* 90  BUN 30* 41*  CREATININE 0.97 1.67*  CALCIUM 8.8 9.4  MG -- --  PHOS -- --   Liver Function Tests:  Lab 10/03/11 0415 10/02/11 1736  AST 17 20  ALT 10 12  ALKPHOS 79 88  BILITOT 0.4 0.4  PROT 6.1 7.0  ALBUMIN 3.1* 3.7    Lab 10/02/11 2348  AMMONIA 27   CBC:  Lab 10/03/11 0620 10/02/11 1736  WBC 9.9 10.7*  NEUTROABS  6.9 7.5  HGB 12.3 12.8  HCT 37.8 38.5  MCV 89.2 88.7  PLT 213 171   Cardiac Enzymes:  Lab 10/03/11 0200  CKTOTAL 125  CKMB 3.4  CKMBINDEX --  TROPONINI <0.30   CBG:  Lab 10/03/11 0559 10/03/11 0528 10/03/11 0211 10/03/11 0025 10/02/11 2344  GLUCAP 154* 65* 77 163* 65*    Recent Results (from the past 240 hour(s))  MRSA PCR SCREENING     Status: Abnormal   Collection Time   10/03/11  1:56 AM       Component Value Range Status Comment   MRSA by PCR POSITIVE (*) NEGATIVE Final     Studies: Ct Head Wo Contrast  10/02/2011  *RADIOLOGY REPORT*  Clinical Data: Altered mental status.  CT HEAD WITHOUT CONTRAST  Technique:  Contiguous axial images were obtained from the base of the skull through the vertex without contrast.  Comparison: 02/03/2010  Findings: Chronic sphenoid sinus disease. Atherosclerotic and physiologic intracranial calcifications.  Bilateral basal ganglia mineralization. Diffuse parenchymal atrophy. Patchy areas of hypoattenuation in deep and periventricular white matter bilaterally. Negative for acute intracranial hemorrhage, mass lesion, acute infarction, midline shift, or mass-effect. Acute infarct may be inapparent on noncontrast CT. Ventricles and sulci symmetric. Bone windows demonstrate no focal lesion.  IMPRESSION:  1. Negative for bleed or other acute intracranial process.  2. Atrophy and nonspecific white matter changes  Original Report Authenticated By: Osa Craver, M.D.   Mri Brain Without Contrast  10/03/2011  *RADIOLOGY REPORT*  Clinical Data: Altered mental status  MRI HEAD WITHOUT CONTRAST  Technique:  Multiplanar, multiecho pulse sequences of the brain and surrounding structures were obtained according to standard protocol without intravenous contrast.  Comparison: CT head 10/02/2011  Findings: Two small areas of possible restricted diffusion in the right frontal white matter may represent acute infarct versus artifact.  No other areas of restricted diffusion.  Moderate atrophy.  Mild chronic microvascular ischemia in the white matter.  Mild chronic ischemia in the basal ganglia and pons. Negative for hemorrhage or mass lesion.  Chronic opacification of the right sphenoid sinus.  IMPRESSION: Atrophy and chronic microvascular ischemia.  Question of two small areas of acute infarction in the right frontal white matter.  Original Report Authenticated By: Camelia Phenes, M.D.   Dg Chest Portable 1 View  10/02/2011  *RADIOLOGY REPORT*  Clinical Data: Altered mental status.  PORTABLE CHEST - 1 VIEW  Comparison: None.  Findings: The patient is rotated on the study.  There is cardiomegaly.  Lungs are clear.  No pneumothorax or pleural fluid.  IMPRESSION: Cardiomegaly without acute disease.  Original Report Authenticated By: Bernadene Bell. D'ALESSIO, M.D.   Scheduled Meds:   . antiseptic oral rinse  15 mL Mouth Rinse BID  . dextrose  1 ampule Intravenous Once  . dextrose      . enoxaparin (LOVENOX) injection  30 mg Subcutaneous Daily  . ondansetron (ZOFRAN) IV  4 mg Intravenous Once  . sodium chloride  500 mL Intravenous Once   Continuous Infusions:   . dextrose 5 % and 0.45% NaCl 75 mL/hr at 10/03/11 1100  . DISCONTD: sodium chloride 100 mL/hr at 10/03/11 1000    Principal Problem:  *Acute encephalopathy Active Problems:  Dementia  Renal failure  Hypertension  History of DVT (deep vein thrombosis)  B12 deficiency     Brendia Sacks, MD  [facility] Team 1 Pager [phone]. If 7PM-7AM, please contact night-coverage at [email or web address], password [facility] 10/03/2011, 11:33 AM  LOS: 1 day   Time spent: 25 minutes

## 2011-10-03 NOTE — Progress Notes (Signed)
Hypoglycemic Event  CBG: 66  Treatment: D50 IV 25 mL  Symptoms: None  Follow-up CBG: Time:1015 CBG Result:143  Possible Reasons for Event: Other: NPO  Comments/MD notified:Dr. Letitia Caul, Davene Costain  Remember to initiate Hypoglycemia Order Set & complete

## 2011-10-04 LAB — BASIC METABOLIC PANEL
Calcium: 8.6 mg/dL (ref 8.4–10.5)
GFR calc non Af Amer: 67 mL/min — ABNORMAL LOW (ref >90)
Sodium: 143 mEq/L (ref 135–145)

## 2011-10-04 LAB — GLUCOSE, CAPILLARY
Glucose-Capillary: 85 mg/dL (ref 70–99)
Glucose-Capillary: 106 mg/dL — ABNORMAL HIGH (ref 70–99)
Glucose-Capillary: 108 mg/dL — ABNORMAL HIGH (ref 70–99)

## 2011-10-04 LAB — HEMOGLOBIN A1C: Hgb A1c MFr Bld: 5.4 % (ref <5.7)

## 2011-10-04 LAB — LIPID PANEL
Cholesterol: 213 mg/dL — ABNORMAL HIGH (ref 0–200)
LDL Cholesterol: 124 mg/dL — ABNORMAL HIGH (ref 0–99)
Triglycerides: 97 mg/dL (ref <150)

## 2011-10-04 MED ORDER — ACETAMINOPHEN 325 MG PO TABS
650.0000 mg | ORAL_TABLET | Freq: Four times a day (QID) | ORAL | Status: DC | PRN
  Administered 2011-10-04: 650 mg via ORAL
  Filled 2011-10-04: qty 2

## 2011-10-04 MED ORDER — SIMVASTATIN 10 MG PO TABS
10.0000 mg | ORAL_TABLET | Freq: Every day | ORAL | Status: DC
  Administered 2011-10-04: 10 mg via ORAL
  Filled 2011-10-04 (×2): qty 1

## 2011-10-04 MED ORDER — CHLORHEXIDINE GLUCONATE CLOTH 2 % EX PADS
6.0000 | MEDICATED_PAD | Freq: Every day | CUTANEOUS | Status: DC
  Administered 2011-10-04: 6 via TOPICAL

## 2011-10-04 MED ORDER — MUPIROCIN 2 % EX OINT
1.0000 "application " | TOPICAL_OINTMENT | Freq: Two times a day (BID) | CUTANEOUS | Status: DC
  Administered 2011-10-04 – 2011-10-05 (×3): 1 via NASAL
  Filled 2011-10-04: qty 22

## 2011-10-04 NOTE — Progress Notes (Signed)
Occupational Therapy Discharge Patient Details Name: Katie Gibson MRN: [id number] DOB: [birth year] Today's Date: 10/04/2011 Time:  -     Patient discharged from OT services secondary to pt if from SNF with plans to return to SNF tomorrow (8/16). Will defer OT eval to SNF per departmental protocol. Thank you.  Glendale Chard, OTR/L Pager: [phone] 10/04/2011       [name] 10/04/2011, 11:25 AM

## 2011-10-04 NOTE — Progress Notes (Signed)
VASCULAR LAB PRELIMINARY  PRELIMINARY  PRELIMINARY  PRELIMINARY Carotid duplex  completed.    Preliminary report:  Bilateral:  No evidence of hemodynamically significant internal carotid artery stenosis.   Vertebral artery flow is antegrade.     Katie Gibson, RVS 10/04/2011, 12:34 PM

## 2011-10-04 NOTE — Progress Notes (Signed)
[facility] PROGRESS NOTE  Katie Gibson ZOX:[id number] DOB: Jun 12, 1910 DOA: 10/02/2011 PCP: [name], MD  Assessment/Plan: Principal Problem:  *Acute encephalopathy Active Problems:  Dementia  Renal failure  Hypertension  History of DVT (deep vein thrombosis)  CVA (cerebral infarction)  Hypoglycemia  Low TSH level   1. Acute encephalopathy: Precise etiology is uncertain, but likely to have been due to a combination of causes, including narcotic medication, hypoglycemia, as well as a possible CVA. Given the acuity of symptoms, and rapid response to correction of hypoglycemia, this may have been the predominant culprit. Following CBGs and these have remained normal. Urine drug screen is positive only for opiates. Mental status appears at baseline, today. 2. Possible acute infarction/stroke: Brain MRI showed question of two small areas of acute infarction in the right frontal white matter. Continued on  ASA. CVA work up in progress, including  2-D echocardiogram, carotid duplex.  PT eval. ST eval noted. Have consulted neurologist. Will await recommendations.  3. Hypertension: BP is sub-optimally controlled, but may be appropriate, in context of possible acute CVA. Will observe for now.  4. Dementia: Stable, otherwise. On Namenda/Aricept.  5. Dehydration/AKI: Patient presented with a creatinine of 1.67 and BUN 41, consistent with AKI, on the basis of dehydration and diminished oral intake. Patient was managed with iv fluid hydration, with resolution. Creatinine was 0.97 on 10/03/11. 5. B12 deficiency. On replacement therapy.    Code Status: Full Code. Family Communication:  Disposition Plan: to be determined.   Brief narrative: 78 year old female with history of hypertension, dementia, previous history of DVT, B12 deficiency was found to be suddenly unresponsive at around 6 PM on 10/02/11. Patient's daughter states she had gone to visit the patient. Patient was sitting at  the nursing station at the nursing home when patient suddenly became unresponsive. She was quickly taken to her room but did not improve, so she was brought to the ER and CT did not show any acute. Patient is afebrile. Chest x-ray and urinalysis are unremarkable. Narcan 0.4 mg IV was given with no significant response. Repeat CBG was 60. An ampule of D50W was administered, and patient admitted for further management.    Consultants:  N/A.  Procedures:  Head CT scan  Brain MRI.  CXR.  Antibiotics:  N/A.  HPI/Subjective: No new issues.  Objective: Vital signs in last 24 hours: Temp:  [98.5 F (36.9 C)-99.1 F (37.3 C)] 98.7 F (37.1 C) (08/15 0635) Pulse Rate:  [62-73] 64  (08/15 0635) Resp:  [14-27] 18  (08/15 0635) BP: (132-164)/(40-77) 162/52 mmHg (08/15 0635) SpO2:  [92 %-100 %] 98 % (08/15 0635) Weight change:     Intake/Output from previous day: 08/14 0701 - 08/15 0700 In: 976.3 [I.V.:976.3] Out: 830 [Urine:830]     Physical Exam: General: Comfortable, alert, communicative, fully oriented, not short of breath at rest.  HEENT:  No clinical pallor, no jaundice, no conjunctival injection or discharge. Hydration status is satisfactory.  NECK:  Supple, JVP not seen, no carotid bruits, no palpable lymphadenopathy, no palpable goiter. CHEST:  Clinically clear to auscultation, no wheezes, no crackles. HEART:  Sounds 1 and 2 heard, normal, regular, no murmurs. ABDOMEN:  Full, soft, no scars, non-tender, no palpable organomegaly, no palpable masses, normal bowel sounds. GENITALIA:  Not examined. LOWER EXTREMITIES:  No pitting edema, palpable peripheral pulses. MUSCULOSKELETAL SYSTEM:  Generalized osteoarthritic changes, otherwise, normal. CENTRAL NERVOUS SYSTEM:  No focal neurologic deficit on gross examination.  Lab Results:  [facility] 10/03/11 8119  10/02/11 1736  WBC 9.9 10.7*  HGB 12.3 12.8  HCT 37.8 38.5  PLT 213 171    Basename 10/03/11 0415 10/02/11 1736    NA 143 142  K 3.5 4.1  CL 107 104  CO2 24 24  GLUCOSE 66* 90  BUN 30* 41*  CREATININE 0.97 1.67*  CALCIUM 8.8 9.4   Recent Results (from the past 240 hour(s))  URINE CULTURE     Status: Normal   Collection Time   10/02/11  6:05 PM      Component Value Range Status Comment   Specimen Description URINE, CATHETERIZED   Final    Special Requests NONE   Final    Culture  Setup Time 10/02/2011 18:40   Final    Colony Count NO GROWTH   Final    Culture NO GROWTH   Final    Report Status 10/03/2011 FINAL   Final   MRSA PCR SCREENING     Status: Abnormal   Collection Time   10/03/11  1:56 AM      Component Value Range Status Comment   MRSA by PCR POSITIVE (*) NEGATIVE Final      Studies/Results: Ct Head Wo Contrast  10/02/2011  *RADIOLOGY REPORT*  Clinical Data: Altered mental status.  CT HEAD WITHOUT CONTRAST  Technique:  Contiguous axial images were obtained from the base of the skull through the vertex without contrast.  Comparison: 02/03/2010  Findings: Chronic sphenoid sinus disease. Atherosclerotic and physiologic intracranial calcifications.  Bilateral basal ganglia mineralization. Diffuse parenchymal atrophy. Patchy areas of hypoattenuation in deep and periventricular white matter bilaterally. Negative for acute intracranial hemorrhage, mass lesion, acute infarction, midline shift, or mass-effect. Acute infarct may be inapparent on noncontrast CT. Ventricles and sulci symmetric. Bone windows demonstrate no focal lesion.  IMPRESSION:  1. Negative for bleed or other acute intracranial process.  2. Atrophy and nonspecific white matter changes  Original Report Authenticated By: Osa Craver, M.D.   Mri Brain Without Contrast  10/03/2011  *RADIOLOGY REPORT*  Clinical Data: Altered mental status  MRI HEAD WITHOUT CONTRAST  Technique:  Multiplanar, multiecho pulse sequences of the brain and surrounding structures were obtained according to standard protocol without intravenous  contrast.  Comparison: CT head 10/02/2011  Findings: Two small areas of possible restricted diffusion in the right frontal white matter may represent acute infarct versus artifact.  No other areas of restricted diffusion.  Moderate atrophy.  Mild chronic microvascular ischemia in the white matter.  Mild chronic ischemia in the basal ganglia and pons. Negative for hemorrhage or mass lesion.  Chronic opacification of the right sphenoid sinus.  IMPRESSION: Atrophy and chronic microvascular ischemia.  Question of two small areas of acute infarction in the right frontal white matter.  Original Report Authenticated By: Camelia Phenes, M.D.   Dg Chest Portable 1 View  10/02/2011  *RADIOLOGY REPORT*  Clinical Data: Altered mental status.  PORTABLE CHEST - 1 VIEW  Comparison: None.  Findings: The patient is rotated on the study.  There is cardiomegaly.  Lungs are clear.  No pneumothorax or pleural fluid.  IMPRESSION: Cardiomegaly without acute disease.  Original Report Authenticated By: Bernadene Bell. Maricela Curet, M.D.    Medications: Scheduled Meds:   . antiseptic oral rinse  15 mL Mouth Rinse BID  . aspirin EC  81 mg Oral Daily  . donepezil  10 mg Oral QHS  . memantine  10 mg Oral BID  . DISCONTD: enoxaparin (LOVENOX) injection  30 mg Subcutaneous Daily  Continuous Infusions:   . dextrose 5 % and 0.45% NaCl 75 mL/hr at 10/03/11 1800  . DISCONTD: sodium chloride 100 mL/hr at 10/03/11 1000   PRN Meds:.dextrose, hydrALAZINE, naloxone (NARCAN) injection    LOS: 2 days   Grethel Zenk,CHRISTOPHER  [facility] Pager [phone]. If 8PM-8AM, please contact night-coverage at [email or web address], password [facility] 10/04/2011, 8:30 AM  LOS: 2 days

## 2011-10-04 NOTE — Progress Notes (Signed)
Speech Language Pathology Dysphagia Treatment Patient Details Name: Katie Gibson MRN: [id number] DOB: 03-02-1910 Today's Date: 10/04/2011 Time: 5784-6962 SLP Time Calculation (min): 15 min  Assessment / Plan / Recommendation Clinical Impression  Treatment focused on skilled observation with pos for diet tolerance. Patient alert and pleasant. Able to self feed clincian provided pos trials without overt indication of dysphagia or aspiration. Overall, appears to be tolerating current diet. No further SLP needs indicated at this time.  (will maintain a mechanical soft diet due to missing dentitio)    Diet Recommendation  Continue with Current Diet: Dysphagia 3 (mechanical soft);Thin liquid    SLP Plan All goals met   Pertinent Vitals/Pain n/a   Swallowing Goals  SLP Swallowing Goals Patient will consume recommended diet without observed clinical signs of aspiration with: Supervision/safety Swallow Study Goal #1 - Progress: Met Patient will utilize recommended strategies during swallow to increase swallowing safety with: Supervision/safety Swallow Study Goal #2 - Progress: Met  General Temperature Spikes Noted: No Respiratory Status: Room air Behavior/Cognition: Cooperative;Pleasant mood;Alert Oral Cavity - Dentition: Missing dentition;Poor condition Patient Positioning: Upright in bed       Dysphagia Treatment Treatment focused on: Skilled observation of diet tolerance Treatment Methods/Modalities: Skilled observation Patient observed directly with PO's: Yes Type of PO's observed: Dysphagia 3 (soft);Thin liquids Feeding: Able to feed self Liquids provided via: [employer] MA, CCC-SLP [phone]   Tarrah Furuta Meryl 10/04/2011, 9:58 AM

## 2011-10-04 NOTE — Progress Notes (Signed)
Clinical Social Work: Coverage for Unit   Per medical team and CM: Patient to potentially be ready for DC back to [facility] tomorrow 10/05/2011.  Will update facility and let unit CSW aware of DC if medically stable.  FL2 on chart, please sign MD.  Will facilitate dc once patient ready.  Ashley Jacobs, MSW LCSW [phone]

## 2011-10-04 NOTE — Progress Notes (Signed)
  Echocardiogram 2D Echocardiogram has been performed.  Katie Gibson FRANCES 10/04/2011, 12:50 PM

## 2011-10-04 NOTE — Consult Note (Signed)
[facility] CONSULT NOTE     Reason for Consult: question stroke    HPI:    Katie Gibson is an 78 y.o. female hypertension, dementia, previous history of DVT, B12 deficiency  Who was found unresponsive at nursing home with CBG 60. After hypoglycemia was corrected patients mental status improved. While the patient underwent stroke workup for possible cause of AMS.  MRI showed question of two small areas of acute infarction in the right frontal white matter as evidenced by DWI.  Neurology was asked to recommend treatment.    Past Medical History  Diagnosis Date  . Dementia   . DVT (deep venous thrombosis)   . Hypertension   . Hyperlipidemia   . Depression   . GERD (gastroesophageal reflux disease)   . Coronary artery disease     Past Surgical History  Procedure Date  . Eye surgery   . Cataract extractions with lens implantation     Family History  Problem Relation Age of Onset  . Stroke Daughter     Social History:  reports that she has never smoked. She does not have any smokeless tobacco history on file. She reports that she does not drink alcohol or use illicit drugs.  No Known Allergies  Medications:    Prior to Admission:  Prescriptions prior to admission  Medication Sig Dispense Refill  . amLODipine (NORVASC) 10 MG tablet Take 10 mg by mouth daily.      Marland Kitchen aspirin EC 81 MG tablet Take 81 mg by mouth daily.      . bisacodyl (DULCOLAX) 10 MG suppository Place 10 mg rectally daily as needed. For constipation if mom does not work      . Cyanocobalamin (VITAMIN B 12 PO) Take 1,000 mg by mouth daily.      Marland Kitchen donepezil (ARICEPT) 10 MG tablet Take 10 mg by mouth at bedtime.      Marland Kitchen HYDROcodone-acetaminophen (VICODIN) 5-500 MG per tablet Take 1 tablet by mouth every 4 (four) hours as needed. For pain      . LORazepam (ATIVAN) 0.5 MG tablet Take 0.5 mg by mouth every 8 (eight) hours as needed. For agitation      . magnesium hydroxide  (MILK OF MAGNESIA) 400 MG/5ML suspension Take 30 mLs by mouth daily as needed. For constipation      . memantine (NAMENDA) 10 MG tablet Take 10 mg by mouth 2 (two) times daily.      Marland Kitchen senna-docusate (SENOKOT-S) 8.6-50 MG per tablet Take 2 tablets by mouth daily.      . Sodium Phosphates (RA SALINE ENEMA) 19-7 GM/118ML ENEM Place 1 enema rectally daily as needed. For constipation when mom, and bisacodyl supp does not work      . traMADol (ULTRAM) 50 MG tablet Take 100 mg by mouth 2 (two) times daily. For generalized pain       Scheduled:    . antiseptic oral rinse  15 mL Mouth Rinse BID  . aspirin EC  81 mg Oral Daily  . Chlorhexidine Gluconate Cloth  6 each Topical Q0600  . donepezil  10 mg Oral QHS  . memantine  10 mg Oral BID  . mupirocin ointment  1 application Nasal BID    Review of Systems - General ROS: negative for - chills, fatigue, fever or hot flashes Hematological and Lymphatic ROS: negative for - bruising,  fatigue, jaundice or pallor Endocrine ROS: negative for - hair pattern changes, hot flashes, mood swings or skin changes Respiratory ROS: negative for - cough, hemoptysis, orthopnea or wheezing Cardiovascular ROS: negative for - dyspnea on exertion, orthopnea, palpitations or shortness of breath Gastrointestinal ROS: negative for - abdominal pain, appetite loss, blood in stools, diarrhea or hematemesis Musculoskeletal ROS: negative for - joint pain, joint stiffness, joint swelling or muscle pain Neurological ROS: positive for - confusion and weakness Dermatological ROS: negative for dry skin, pruritus and rash   Blood pressure 145/51, pulse 62, temperature 98.3 F (36.8 C), temperature source Oral, resp. rate 18, height 5\' 7"  (1.702 m), weight 57.9 kg (127 lb 10.3 oz), SpO2 98.00%.   Neurologic Examination:  Mental Status: Alert, able to follow 3 step commands, does not know date, year, location, month when asked.   Cranial Nerves: II-Visual fields grossly  intact. III/IV/VI-Extraocular movements intact.  Pupils reactive bilaterally. Ptosis not present. V/VII-Smile symmetric VIII-grossly intact IX/X-normal gag XI-bilateral shoulder shrug XII-midline tongue extension Motor: 4/5 bilaterally with normal tone and decreased bulk throughout.  Bilateral hands shows wasting of intrinsic muscles.  Sensory: Pinprick and light touch intact throughout, bilaterally--she also states even light touch is uncomfortable to her.  Deep Tendon Reflexes:  Right: Upper Extremity   Left: Upper extremity   biceps (C-5 to C-6) 1/4   biceps (C-5 to C-6) 1/4 tricep (C7) 1/4    triceps (C7) 1/4 Brachioradialis (C6) 1/4  Brachioradialis (C6) 1/4  Lower Extremity Lower Extremity  quadriceps (L-2 to L-4) 2/4   quadriceps (L-2 to L-4) 2/4 Achilles (S1) 0/4   Achilles (S1) 0/4      Plantars:      Right:  downgoing     Left:  Downgoing Cerebellar: Normal finger-to-nose, normal heel to shin  Lab Results  Component Value Date/Time   CHOL 213* 10/04/2011  7:00 AM    Results for orders placed during the hospital encounter of 10/02/11 (from the past 48 hour(s))  CBC WITH DIFFERENTIAL     Status: Abnormal   Collection Time   10/02/11  5:36 PM      Component Value Range Comment   WBC 10.7 (*) 4.0 - 10.5 K/uL WHITE COUNT CONFIRMED ON SMEAR   RBC 4.34  3.87 - 5.11 MIL/uL    Hemoglobin 12.8  12.0 - 15.0 g/dL    HCT 16.1  09.6 - 04.5 %    MCV 88.7  78.0 - 100.0 fL    MCH 29.5  26.0 - 34.0 pg    MCHC 33.2  30.0 - 36.0 g/dL    RDW 40.9 (*) 81.1 - 15.5 %    Platelets 171  150 - 400 K/uL PLATELET COUNT CONFIRMED BY SMEAR   Neutrophils Relative 70  43 - 77 %    Lymphocytes Relative 16  12 - 46 %    Monocytes Relative 13 (*) 3 - 12 %    Eosinophils Relative 1  0 - 5 %    Basophils Relative 0  0 - 1 %    Neutro Abs 7.5  1.7 - 7.7 K/uL    Lymphs Abs 1.7  0.7 - 4.0 K/uL    Monocytes Absolute 1.4 (*) 0.1 - 1.0 K/uL    Eosinophils Absolute 0.1  0.0 - 0.7 K/uL    Basophils  Absolute 0.0  0.0 - 0.1 K/uL    Smear Review MORPHOLOGY UNREMARKABLE     COMPREHENSIVE METABOLIC PANEL     Status: Abnormal  Collection Time   10/02/11  5:36 PM      Component Value Range Comment   Sodium 142  135 - 145 mEq/L    Potassium 4.1  3.5 - 5.1 mEq/L    Chloride 104  96 - 112 mEq/L    CO2 24  19 - 32 mEq/L    Glucose, Bld 90  70 - 99 mg/dL    BUN 41 (*) 6 - 23 mg/dL    Creatinine, Ser 4.09 (*) 0.50 - 1.10 mg/dL    Calcium 9.4  8.4 - 81.1 mg/dL    Total Protein 7.0  6.0 - 8.3 g/dL    Albumin 3.7  3.5 - 5.2 g/dL    AST 20  0 - 37 U/L    ALT 12  0 - 35 U/L    Alkaline Phosphatase 88  39 - 117 U/L    Total Bilirubin 0.4  0.3 - 1.2 mg/dL    GFR calc non Af Amer 24 (*) >90 mL/min    GFR calc Af Amer 28 (*) >90 mL/min   GLUCOSE, CAPILLARY     Status: Normal   Collection Time   10/02/11  5:43 PM      Component Value Range Comment   Glucose-Capillary 86  70 - 99 mg/dL   URINALYSIS, ROUTINE W REFLEX MICROSCOPIC     Status: Abnormal   Collection Time   10/02/11  6:05 PM      Component Value Range Comment   Color, Urine AMBER (*) YELLOW BIOCHEMICALS MAY BE AFFECTED BY COLOR   APPearance CLOUDY (*) CLEAR    Specific Gravity, Urine 1.024  1.005 - 1.030    pH 5.0  5.0 - 8.0    Glucose, UA NEGATIVE  NEGATIVE mg/dL    Hgb urine dipstick NEGATIVE  NEGATIVE    Bilirubin Urine MODERATE (*) NEGATIVE    Ketones, ur 15 (*) NEGATIVE mg/dL    Protein, ur 30 (*) NEGATIVE mg/dL    Urobilinogen, UA 1.0  0.0 - 1.0 mg/dL    Nitrite NEGATIVE  NEGATIVE    Leukocytes, UA NEGATIVE  NEGATIVE   URINE CULTURE     Status: Normal   Collection Time   10/02/11  6:05 PM      Component Value Range Comment   Specimen Description URINE, CATHETERIZED      Special Requests NONE      Culture  Setup Time 10/02/2011 18:40      Colony Count NO GROWTH      Culture NO GROWTH      Report Status 10/03/2011 FINAL     URINE MICROSCOPIC-ADD ON     Status: Abnormal   Collection Time   10/02/11  6:05 PM       Component Value Range Comment   Squamous Epithelial / LPF RARE  RARE    WBC, UA 0-2  <3 WBC/hpf    RBC / HPF 0-2  <3 RBC/hpf    Bacteria, UA RARE  RARE    Casts MANY HYALINE CASTS AND RARE GRANULAR CASTS  NEGATIVE    Crystals CA OXALATE CRYSTALS (*) NEGATIVE    Urine-Other AMORPHOUS URATES/PHOSPHATES     URINE RAPID DRUG SCREEN (HOSP PERFORMED)     Status: Abnormal   Collection Time   10/02/11  6:05 PM      Component Value Range Comment   Opiates POSITIVE (*) NONE DETECTED    Cocaine NONE DETECTED  NONE DETECTED    Benzodiazepines NONE DETECTED  NONE DETECTED  Amphetamines NONE DETECTED  NONE DETECTED    Tetrahydrocannabinol NONE DETECTED  NONE DETECTED    Barbiturates NONE DETECTED  NONE DETECTED   URINALYSIS, WITH MICROSCOPIC     Status: Abnormal   Collection Time   10/02/11 11:26 PM      Component Value Range Comment   Color, Urine YELLOW  YELLOW    APPearance CLOUDY (*) CLEAR    Specific Gravity, Urine 1.019  1.005 - 1.030    pH 5.0  5.0 - 8.0    Glucose, UA NEGATIVE  NEGATIVE mg/dL    Hgb urine dipstick NEGATIVE  NEGATIVE    Bilirubin Urine NEGATIVE  NEGATIVE    Ketones, ur 15 (*) NEGATIVE mg/dL    Protein, ur NEGATIVE  NEGATIVE mg/dL    Urobilinogen, UA 0.2  0.0 - 1.0 mg/dL    Nitrite NEGATIVE  NEGATIVE    Leukocytes, UA NEGATIVE  NEGATIVE    Bacteria, UA RARE  RARE    Squamous Epithelial / LPF RARE  RARE   GLUCOSE, CAPILLARY     Status: Abnormal   Collection Time   10/02/11 11:44 PM      Component Value Range Comment   Glucose-Capillary 65 (*) 70 - 99 mg/dL   POCT I-STAT 3, BLOOD GAS (G3+)     Status: Abnormal   Collection Time   10/02/11 11:46 PM      Component Value Range Comment   pH, Arterial 7.327 (*) 7.350 - 7.450    pCO2 arterial 52.0 (*) 35.0 - 45.0 mmHg    pO2, Arterial 112.0 (*) 80.0 - 100.0 mmHg    Bicarbonate 27.2 (*) 20.0 - 24.0 mEq/L    TCO2 29  0 - 100 mmol/L    O2 Saturation 98.0      Acid-Base Excess 1.0  0.0 - 2.0 mmol/L    Collection site  RADIAL, ALLEN'S TEST ACCEPTABLE      Drawn by Operator      Sample type ARTERIAL     AMMONIA     Status: Normal   Collection Time   10/02/11 11:48 PM      Component Value Range Comment   Ammonia 27  11 - 60 umol/L   LACTIC ACID, PLASMA     Status: Normal   Collection Time   10/02/11 11:53 PM      Component Value Range Comment   Lactic Acid, Venous 1.1  0.5 - 2.2 mmol/L   HEMOGLOBIN A1C     Status: Normal   Collection Time   10/02/11 11:53 PM      Component Value Range Comment   Hemoglobin A1C 5.3  <5.7 %    Mean Plasma Glucose 105  <117 mg/dL   GLUCOSE, CAPILLARY     Status: Abnormal   Collection Time   10/03/11 12:25 AM      Component Value Range Comment   Glucose-Capillary 163 (*) 70 - 99 mg/dL   MRSA PCR SCREENING     Status: Abnormal   Collection Time   10/03/11  1:56 AM      Component Value Range Comment   MRSA by PCR POSITIVE (*) NEGATIVE   CARDIAC PANEL(CRET KIN+CKTOT+MB+TROPI)     Status: Abnormal   Collection Time   10/03/11  2:00 AM      Component Value Range Comment   Total CK 125  7 - 177 U/L    CK, MB 3.4  0.3 - 4.0 ng/mL    Troponin I <0.30  <0.30 ng/mL  Relative Index 2.7 (*) 0.0 - 2.5   GLUCOSE, CAPILLARY     Status: Normal   Collection Time   10/03/11  2:11 AM      Component Value Range Comment   Glucose-Capillary 77  70 - 99 mg/dL   GLUCOSE, CAPILLARY     Status: Abnormal   Collection Time   10/03/11  4:14 AM      Component Value Range Comment   Glucose-Capillary 61 (*) 70 - 99 mg/dL   COMPREHENSIVE METABOLIC PANEL     Status: Abnormal   Collection Time   10/03/11  4:15 AM      Component Value Range Comment   Sodium 143  135 - 145 mEq/L    Potassium 3.5  3.5 - 5.1 mEq/L    Chloride 107  96 - 112 mEq/L    CO2 24  19 - 32 mEq/L    Glucose, Bld 66 (*) 70 - 99 mg/dL    BUN 30 (*) 6 - 23 mg/dL    Creatinine, Ser 4.09  0.50 - 1.10 mg/dL DELTA CHECK NOTED   Calcium 8.8  8.4 - 10.5 mg/dL    Total Protein 6.1  6.0 - 8.3 g/dL    Albumin 3.1 (*) 3.5 - 5.2  g/dL    AST 17  0 - 37 U/L    ALT 10  0 - 35 U/L    Alkaline Phosphatase 79  39 - 117 U/L    Total Bilirubin 0.4  0.3 - 1.2 mg/dL    GFR calc non Af Amer 47 (*) >90 mL/min    GFR calc Af Amer 55 (*) >90 mL/min   TSH     Status: Abnormal   Collection Time   10/03/11  4:15 AM      Component Value Range Comment   TSH 0.256 (*) 0.350 - 4.500 uIU/mL   LIPID PANEL     Status: Abnormal   Collection Time   10/03/11  4:15 AM      Component Value Range Comment   Cholesterol 217 (*) 0 - 200 mg/dL    Triglycerides 68  <811 mg/dL    HDL 74  >91 mg/dL    Total CHOL/HDL Ratio 2.9      VLDL 14  0 - 40 mg/dL    LDL Cholesterol 478 (*) 0 - 99 mg/dL   GLUCOSE, CAPILLARY     Status: Abnormal   Collection Time   10/03/11  5:28 AM      Component Value Range Comment   Glucose-Capillary 65 (*) 70 - 99 mg/dL   GLUCOSE, CAPILLARY     Status: Abnormal   Collection Time   10/03/11  5:59 AM      Component Value Range Comment   Glucose-Capillary 154 (*) 70 - 99 mg/dL   CBC WITH DIFFERENTIAL     Status: Abnormal   Collection Time   10/03/11  6:20 AM      Component Value Range Comment   WBC 9.9  4.0 - 10.5 K/uL    RBC 4.24  3.87 - 5.11 MIL/uL    Hemoglobin 12.3  12.0 - 15.0 g/dL    HCT 29.5  62.1 - 30.8 %    MCV 89.2  78.0 - 100.0 fL    MCH 29.0  26.0 - 34.0 pg    MCHC 32.5  30.0 - 36.0 g/dL    RDW 65.7 (*) 84.6 - 15.5 %    Platelets 213  150 - 400 K/uL  Neutrophils Relative 70  43 - 77 %    Neutro Abs 6.9  1.7 - 7.7 K/uL    Lymphocytes Relative 20  12 - 46 %    Lymphs Abs 2.0  0.7 - 4.0 K/uL    Monocytes Relative 9  3 - 12 %    Monocytes Absolute 0.8  0.1 - 1.0 K/uL    Eosinophils Relative 1  0 - 5 %    Eosinophils Absolute 0.1  0.0 - 0.7 K/uL    Basophils Relative 0  0 - 1 %    Basophils Absolute 0.0  0.0 - 0.1 K/uL   GLUCOSE, CAPILLARY     Status: Normal   Collection Time   10/03/11  8:23 AM      Component Value Range Comment   Glucose-Capillary 76  70 - 99 mg/dL   GLUCOSE, CAPILLARY      Status: Abnormal   Collection Time   10/03/11  9:55 AM      Component Value Range Comment   Glucose-Capillary 66 (*) 70 - 99 mg/dL   GLUCOSE, CAPILLARY     Status: Abnormal   Collection Time   10/03/11 10:19 AM      Component Value Range Comment   Glucose-Capillary 143 (*) 70 - 99 mg/dL   GLUCOSE, CAPILLARY     Status: Normal   Collection Time   10/03/11 12:16 PM      Component Value Range Comment   Glucose-Capillary 88  70 - 99 mg/dL   GLUCOSE, CAPILLARY     Status: Abnormal   Collection Time   10/03/11  2:06 PM      Component Value Range Comment   Glucose-Capillary 135 (*) 70 - 99 mg/dL   GLUCOSE, CAPILLARY     Status: Normal   Collection Time   10/03/11  4:55 PM      Component Value Range Comment   Glucose-Capillary 76  70 - 99 mg/dL   GLUCOSE, CAPILLARY     Status: Normal   Collection Time   10/03/11  8:54 PM      Component Value Range Comment   Glucose-Capillary 77  70 - 99 mg/dL    Comment 1 Documented in Chart      Comment 2 Notify RN     GLUCOSE, CAPILLARY     Status: Normal   Collection Time   10/04/11 12:17 AM      Component Value Range Comment   Glucose-Capillary 85  70 - 99 mg/dL   GLUCOSE, CAPILLARY     Status: Normal   Collection Time   10/04/11  6:39 AM      Component Value Range Comment   Glucose-Capillary 73  70 - 99 mg/dL    Comment 1 Documented in Chart      Comment 2 Notify RN     GLUCOSE, CAPILLARY     Status: Normal   Collection Time   10/04/11  7:59 AM      Component Value Range Comment   Glucose-Capillary 80  70 - 99 mg/dL     Ct Head Wo Contrast  10/02/2011  *RADIOLOGY REPORT*  Clinical Data: Altered mental status.  CT HEAD WITHOUT CONTRAST  Technique:  Contiguous axial images were obtained from the base of the skull through the vertex without contrast.  Comparison: 02/03/2010  Findings: Chronic sphenoid sinus disease. Atherosclerotic and physiologic intracranial calcifications.  Bilateral basal ganglia mineralization. Diffuse parenchymal atrophy.  Patchy areas of hypoattenuation in deep and periventricular white matter bilaterally.  Negative for acute intracranial hemorrhage, mass lesion, acute infarction, midline shift, or mass-effect. Acute infarct may be inapparent on noncontrast CT. Ventricles and sulci symmetric. Bone windows demonstrate no focal lesion.  IMPRESSION:  1. Negative for bleed or other acute intracranial process.  2. Atrophy and nonspecific white matter changes  Original Report Authenticated By: Osa Craver, M.D.   Mri Brain Without Contrast  10/03/2011  *RADIOLOGY REPORT*  Clinical Data: Altered mental status  MRI HEAD WITHOUT CONTRAST  Technique:  Multiplanar, multiecho pulse sequences of the brain and surrounding structures were obtained according to standard protocol without intravenous contrast.  Comparison: CT head 10/02/2011  Findings: Two small areas of possible restricted diffusion in the right frontal white matter may represent acute infarct versus artifact.  No other areas of restricted diffusion.  Moderate atrophy.  Mild chronic microvascular ischemia in the white matter.  Mild chronic ischemia in the basal ganglia and pons. Negative for hemorrhage or mass lesion.  Chronic opacification of the right sphenoid sinus.  IMPRESSION: Atrophy and chronic microvascular ischemia.  Question of two small areas of acute infarction in the right frontal white matter.  Original Report Authenticated By: Camelia Phenes, M.D.   Dg Chest Portable 1 View  10/02/2011  *RADIOLOGY REPORT*  Clinical Data: Altered mental status.  PORTABLE CHEST - 1 VIEW  Comparison: None.  Findings: The patient is rotated on the study.  There is cardiomegaly.  Lungs are clear.  No pneumothorax or pleural fluid.  IMPRESSION: Cardiomegaly without acute disease.  Original Report Authenticated By: Bernadene Bell. Maricela Curet, M.D.     Assessment/Plan:   78 YO female with transient AMS in setting of hypoglycemia which has now resolved.  Two small areas of  questionable infarct are seen on MRI.  Given location and patients risk factors, these infarcts are likely small vessel versus artifact.    Carotid dopplers did not indicate any abnormalities. Given  The location of the stroke, it is less likely that it is causing the change in mental status. She is on chronic narcotics, progressive dementia and other metabolic derangement may be playing a  Major role in her mental status.    1) Continue ASA 81 mg daily 2) Start on low dose statin for hyperlipidemia 3) No further input  No further neurology recommendations.   Felicie Morn PA-C [facility] [phone]  10/04/2011, 12:55 PM  Cylas Falzone V-P Eilleen Kempf., MD., Ph.D.,MS 10/04/2011 12:59 PM

## 2011-10-04 NOTE — Evaluation (Signed)
Physical Therapy Evaluation Patient Details Name: Katie Gibson MRN: [id number] DOB: [birth year] Today's Date: 10/04/2011 Time: 4098-1191 PT Time Calculation (min): 28 min  PT Assessment / Plan / Recommendation Clinical Impression  Pt admitted with AMS and decreased responsiveness, r/o CVA. According to daughter, pt is at her baseline cognitive and functional level. Plan to return to SNF for long term care. No further acute PT needs, will not follow. Please reorder if necessary.    PT Assessment  All further PT needs can be met in the next venue of care (at SNF)    Follow Up Recommendations  Skilled nursing facility;Supervision/Assistance - 24 hour    Barriers to Discharge        Equipment Recommendations  Defer to next venue    Recommendations for Other Services     Frequency      Precautions / Restrictions Precautions Precautions: Fall Restrictions Weight Bearing Restrictions: No         Mobility  Bed Mobility Bed Mobility: Rolling Right;Rolling Left;Supine to Sit;Sitting - Scoot to [place] of Bed;Sit to Supine Rolling Right: 7: Independent Rolling Left: 7: Independent Supine to Sit: 7: Independent Sitting - Scoot to Edge of Bed: 7: Independent Sit to Supine: 7: Independent Transfers Transfers: Sit to Stand;Stand to Sit Sit to Stand: With upper extremity assist;From bed;4: Min guard Stand to Sit: With upper extremity assist;To bed;4: Min guard Stand Pivot Transfers: 4: Min guard;With armrests Details for Transfer Assistance: VC for hand placement. Minguard for safety Ambulation/Gait Ambulation/Gait Assistance: Not tested (comment)      Visit Information  Last PT Received On: 10/04/11 Assistance Needed: +1    Subjective Data  Patient Stated Goal: to go back home   Prior Functioning  Home Living Additional Comments: lives at [facility] Prior Function Level of Independence: Needs assistance Needs Assistance: Bathing Bath: Moderate Able to  Take Stairs?: No Driving: No Vocation: Retired Comments: feeds self, walks with walker, intermittent assistance at [employer] Communication: No difficulties Dominant Hand: Right    Cognition  Overall Cognitive Status: History of cognitive impairments - at baseline Arousal/Alertness: Awake/alert Orientation Level: Disoriented to;Place;Time;Situation Behavior During Session: WFL for tasks performed    Extremity/Trunk Assessment Right Lower Extremity Assessment RLE ROM/Strength/Tone: Within functional levels RLE Sensation: WFL - Light Touch Left Lower Extremity Assessment LLE ROM/Strength/Tone: Within functional levels LLE Sensation: WFL - Light Touch   Balance    End of Session PT - End of Session Equipment Utilized During Treatment: Gait belt Activity Tolerance: Patient tolerated treatment well Patient left: in bed;with call bell/phone within reach Nurse Communication: Mobility status    Milana Kidney 10/04/2011, 5:19 PM  10/04/2011 Milana Kidney DPT PAGER: [phone] OFFICE: [phone]

## 2011-10-05 LAB — GLUCOSE, CAPILLARY
Glucose-Capillary: 66 mg/dL — ABNORMAL LOW (ref 70–99)
Glucose-Capillary: 71 mg/dL (ref 70–99)
Glucose-Capillary: 101 mg/dL — ABNORMAL HIGH (ref 70–99)

## 2011-10-05 LAB — BASIC METABOLIC PANEL
CO2: 24 mEq/L (ref 19–32)
Chloride: 106 mEq/L (ref 96–112)
Creatinine, Ser: 0.68 mg/dL (ref 0.50–1.10)

## 2011-10-05 MED ORDER — LORAZEPAM 0.5 MG PO TABS: 0.5000 mg | ORAL_TABLET | Freq: Three times a day (TID) | ORAL | Status: DC | PRN

## 2011-10-05 MED ORDER — AMLODIPINE BESYLATE 5 MG PO TABS
5.0000 mg | ORAL_TABLET | Freq: Every day | ORAL | Status: DC
  Filled 2011-10-05: qty 1

## 2011-10-05 MED ORDER — HYDROCODONE-ACETAMINOPHEN 5-500 MG PO TABS: 1.0000 | ORAL_TABLET | ORAL | Status: DC | PRN

## 2011-10-05 MED ORDER — SIMVASTATIN 10 MG PO TABS: 10.0000 mg | ORAL_TABLET | Freq: Every day | ORAL | Status: DC

## 2011-10-05 MED ORDER — AMLODIPINE BESYLATE 5 MG PO TABS: 5.0000 mg | ORAL_TABLET | Freq: Every day | ORAL | Status: DC

## 2011-10-05 MED ORDER — TRAMADOL HCL 50 MG PO TABS: 100.0000 mg | ORAL_TABLET | Freq: Two times a day (BID) | ORAL | Status: DC

## 2011-10-05 NOTE — Care Management Note (Signed)
    Page 1 of 1   10/05/2011     2:34:09 PM   CARE MANAGEMENT NOTE 10/05/2011  Patient:  Katie Gibson, Katie Gibson   Account Number:  [id number]  Date Initiated:  10/03/2011  Documentation initiated by:  [facility]  Subjective/Objective Assessment:   Admitted unresponsive, acute encephalopathy, from [facility]     Action/Plan:   Anticipated DC Date:  10/06/2011   Anticipated DC Plan:  SKILLED NURSING FACILITY  In-house referral  Clinical Social Worker      DC Planning Services  CM consult      Choice offered to / List presented to:             Status of service:  Completed, signed off Medicare Important Message given?   (If response is "NO", the following Medicare IM given date fields will be blank) Date Medicare IM given:   Date Additional Medicare IM given:    Discharge Disposition:  SKILLED NURSING FACILITY  Per UR Regulation:  Reviewed for med. necessity/level of care/duration of stay  If discussed at Long Length of Stay Meetings, dates discussed:    Comments:  10/05/11 Onnie Boer, RN, BSN 1432 PT WAS DC'D BACK TO [facility].

## 2011-10-05 NOTE — Clinical Social Work Note (Signed)
Pt is ready for discharge and return [facility]. Facility has received discharge summary and is ready to accept pt. Pt's daughter, Olena Leatherwood [facility]), is agreeable to discharge plan. [facility] will provide transportation to facility. CSW is signing off as no further needs identified.   Dede Query, MSW, Theresia Majors [phone]

## 2011-10-05 NOTE — Discharge Summary (Signed)
Physician Discharge Summary  Katie Gibson EAV:[id number] DOB: [birth year] DOA: 10/02/2011  PCP: Katie Relic, MD  Admit date: 10/02/2011 Discharge date: 10/05/2011  Recommendations for Outpatient Follow-up:  1. Follow up with Primary MD.  2. Follow up with Dr Katie Gibson.   Discharge Diagnoses:  Principal Problem:  *Acute encephalopathy Active Problems:  Dementia  Renal failure  Hypertension  History of DVT (deep vein thrombosis)  CVA (cerebral infarction)  Hypoglycemia  Low TSH level   Discharge Condition: Satisfactory.   Diet recommendation: Heart-healthy.   Filed Weights   10/03/11 0200  Weight: 57.9 kg (127 lb 10.3 oz)    History of present illness:  78 year old female with history of hypertension, dementia, previous history of DVT, B12 deficiency was found to be suddenly unresponsive at around 6 PM on 10/02/11. Patient's daughter states she had gone to visit the patient. Patient was sitting at the nursing station at the nursing home when patient suddenly became unresponsive. She was quickly taken to her room but did not improve, so she was brought to the ER and CT did not show any acute. Patient is afebrile. Chest x-ray and urinalysis are unremarkable. Narcan 0.4 mg IV was given with no significant response. Repeat CBG was 60. An ampule of D50W was administered, and patient admitted for further management.    Hospital Course:  1. Acute encephalopathy: Precise etiology is uncertain, but likely to have been due to a combination of causes, including narcotic medication, hypoglycemia, as well as a possible CVA. Given the acuity of symptoms, and rapid response to correction of hypoglycemia, this may have been the predominant culprit. CBGs were followed, and these have remained normal. Urine drug screen was positive only for opiates. Mental status is now at appears at baseline.  2. Possible acute infarction/stroke: Brain MRI showed question of two small areas of acute  infarction in the right frontal white matter. Continued on ASA. Vascular duplex, showed no evidence of hemodynamically significant internal carotid artery stenosis bilaterally. Vertebral artery flow is antegrade.  2-D echocardiogram showed EF of 55% to 60%, tthere were no regional wall motion abnormalities and no cardiac source of embolism was identified. Dr Minus Breeding, provided neurology consultation. Patent will follow up with neurologist on discharge.   3. Hypertension: BP is sub-optimally controlled, but may be appropriate, in context of possible acute CVA. Low dose Norvasc, has been added to treatment.  4. Dementia: Stable, otherwise. On Namenda/Aricept.  5. Dehydration/AKI: Patient presented with a creatinine of 1.67 and BUN 41, consistent with AKI, on the basis of dehydration and diminished oral intake. Patient was managed with iv fluid hydration, with resolution. Creatinine was 0.78 on 10/04/11.  6. B12 deficiency. On replacement therapy.    Procedures:  See below.  Consultations:  Dr Minus Breeding, neurologist.   Discharge Exam: Filed Vitals:   10/05/11 1038  BP: 165/69  Pulse: 95  Temp: 97.9 F (36.6 C)  Resp: 18   Filed Vitals:   10/05/11 0210 10/05/11 0250 10/05/11 0600 10/05/11 1038  BP: 183/57 160/70 185/71 165/69  Pulse: 77  82 95  Temp: 98.1 F (36.7 C)  97.9 F (36.6 C) 97.9 F (36.6 C)  TempSrc: Oral  Oral Oral  Resp: 18  18 18   Height:      Weight:      SpO2: 98%  98%     General: Comfortable, alert, communicative, fully oriented, not short of breath at rest.  HEENT: No clinical pallor, no jaundice, no conjunctival injection or  discharge. Hydration status is satisfactory.  NECK: Supple, JVP not seen, no carotid bruits, no palpable lymphadenopathy, no palpable goiter.  CHEST: Clinically clear to auscultation, no wheezes, no crackles.  HEART: Sounds 1 and 2 heard, normal, regular, no murmurs.  ABDOMEN: Full, soft, no scars, non-tender, no palpable  organomegaly, no palpable masses, normal bowel sounds.  GENITALIA: Not examined.  LOWER EXTREMITIES: No pitting edema, palpable peripheral pulses.  MUSCULOSKELETAL SYSTEM: Generalized osteoarthritic changes, otherwise, normal.  CENTRAL NERVOUS SYSTEM: No focal neurologic deficit on gross examination.  Discharge Instructions  Discharge Orders    Future Orders Please Complete By Expires   Diet - low sodium heart healthy      Increase activity slowly        Medication List  As of 10/05/2011 11:09 AM   TAKE these medications         amLODipine 5 MG tablet   Commonly known as: NORVASC   Take 1 tablet (5 mg total) by mouth daily.      aspirin EC 81 MG tablet   Take 81 mg by mouth daily.      bisacodyl 10 MG suppository   Commonly known as: DULCOLAX   Place 10 mg rectally daily as needed. For constipation if mom does not work      donepezil 10 MG tablet   Commonly known as: ARICEPT   Take 10 mg by mouth at bedtime.      HYDROcodone-acetaminophen 5-500 MG per tablet   Commonly known as: VICODIN   Take 1 tablet by mouth every 4 (four) hours as needed. For pain      LORazepam 0.5 MG tablet   Commonly known as: ATIVAN   Take 1 tablet (0.5 mg total) by mouth every 8 (eight) hours as needed. For agitation      magnesium hydroxide 400 MG/5ML suspension   Commonly known as: MILK OF MAGNESIA   Take 30 mLs by mouth daily as needed. For constipation      memantine 10 MG tablet   Commonly known as: NAMENDA   Take 10 mg by mouth 2 (two) times daily.      RA SALINE ENEMA 19-7 GM/118ML Enem   Place 1 enema rectally daily as needed. For constipation when mom, and bisacodyl supp does not work      senna-docusate 8.6-50 MG per tablet   Commonly known as: Senokot-S   Take 2 tablets by mouth daily.      simvastatin 10 MG tablet   Commonly known as: ZOCOR   Take 1 tablet (10 mg total) by mouth daily at 6 PM.      traMADol 50 MG tablet   Commonly known as: ULTRAM   Take 2 tablets (100  mg total) by mouth 2 (two) times daily. For generalized pain      VITAMIN B 12 PO   Take 1,000 mg by mouth daily.           Follow-up Information    Follow up with GREEN, Lenon Curt, MD. (As needed)    Contact information:   [address] [place] [address] [phone]       Schedule an appointment as soon as possible for a visit with Gates Rigg, MD.   Contact information:   [address] [facility] [place] [address] [phone]           The results of significant diagnostics from this hospitalization (including imaging, microbiology, ancillary and laboratory) are  listed below for reference.    Significant Diagnostic Studies: Ct Head Wo Contrast  10/02/2011  *RADIOLOGY REPORT*  Clinical Data: Altered mental status.  CT HEAD WITHOUT CONTRAST  Technique:  Contiguous axial images were obtained from the base of the skull through the vertex without contrast.  Comparison: 02/03/2010  Findings: Chronic sphenoid sinus disease. Atherosclerotic and physiologic intracranial calcifications.  Bilateral basal ganglia mineralization. Diffuse parenchymal atrophy. Patchy areas of hypoattenuation in deep and periventricular white matter bilaterally. Negative for acute intracranial hemorrhage, mass lesion, acute infarction, midline shift, or mass-effect. Acute infarct may be inapparent on noncontrast CT. Ventricles and sulci symmetric. Bone windows demonstrate no focal lesion.  IMPRESSION:  1. Negative for bleed or other acute intracranial process.  2. Atrophy and nonspecific white matter changes  Original Report Authenticated By: Osa Craver, M.D.   Mri Brain Without Contrast  10/03/2011  *RADIOLOGY REPORT*  Clinical Data: Altered mental status  MRI HEAD WITHOUT CONTRAST  Technique:  Multiplanar, multiecho pulse sequences of the brain and surrounding structures were obtained according to standard protocol without  intravenous contrast.  Comparison: CT head 10/02/2011  Findings: Two small areas of possible restricted diffusion in the right frontal white matter may represent acute infarct versus artifact.  No other areas of restricted diffusion.  Moderate atrophy.  Mild chronic microvascular ischemia in the white matter.  Mild chronic ischemia in the basal ganglia and pons. Negative for hemorrhage or mass lesion.  Chronic opacification of the right sphenoid sinus.  IMPRESSION: Atrophy and chronic microvascular ischemia.  Question of two small areas of acute infarction in the right frontal white matter.  Original Report Authenticated By: Camelia Phenes, M.D.   Dg Chest Portable 1 View  10/02/2011  *RADIOLOGY REPORT*  Clinical Data: Altered mental status.  PORTABLE CHEST - 1 VIEW  Comparison: None.  Findings: The patient is rotated on the study.  There is cardiomegaly.  Lungs are clear.  No pneumothorax or pleural fluid.  IMPRESSION: Cardiomegaly without acute disease.  Original Report Authenticated By: Bernadene Bell. Maricela Curet, M.D.    Microbiology: Recent Results (from the past 240 hour(s))  URINE CULTURE     Status: Normal   Collection Time   10/02/11  6:05 PM      Component Value Range Status Comment   Specimen Description URINE, CATHETERIZED   Final    Special Requests NONE   Final    Culture  Setup Time 10/02/2011 18:40   Final    Colony Count NO GROWTH   Final    Culture NO GROWTH   Final    Report Status 10/03/2011 FINAL   Final   MRSA PCR SCREENING     Status: Abnormal   Collection Time   10/03/11  1:56 AM      Component Value Range Status Comment   MRSA by PCR POSITIVE (*) NEGATIVE Final      Labs: Basic Metabolic Panel:  Lab 10/05/11 1610 10/04/11 0700 10/03/11 0415 10/02/11 1736  NA 143 143 143 142  K 3.5 3.7 3.5 4.1  CL 106 107 107 104  CO2 24 22 24 24   GLUCOSE 85 81 66* 90  BUN 13 15 30* 41*  CREATININE 0.68 0.78 0.97 1.67*  CALCIUM 9.0 8.6 8.8 9.4  MG -- -- -- --  PHOS -- -- -- --    Liver Function Tests:  Lab 10/03/11 0415 10/02/11 1736  AST 17 20  ALT 10 12  ALKPHOS 79 88  BILITOT 0.4 0.4  PROT 6.1 7.0  ALBUMIN 3.1* 3.7   No results found for this basename: LIPASE:5,AMYLASE:5 in the last 168 hours  Lab 10/02/11 2348  AMMONIA 27   CBC:  Lab 10/03/11 0620 10/02/11 1736  WBC 9.9 10.7*  NEUTROABS 6.9 7.5  HGB 12.3 12.8  HCT 37.8 38.5  MCV 89.2 88.7  PLT 213 171   Cardiac Enzymes:  Lab 10/03/11 0200  CKTOTAL 125  CKMB 3.4  CKMBINDEX --  TROPONINI <0.30   BNP: BNP (last 3 results) No results found for this basename: PROBNP:3 in the last 8760 hours CBG:  Lab 10/05/11 0934 10/05/11 0434 10/05/11 0244 10/05/11 0115 10/04/11 2014  GLUCAP 101* 71 86 66* 108*    Time coordinating discharge: 40 minutes  Signed:  Alin Hutchins,CHRISTOPHER  [facility] 10/05/2011, 11:09 AM

## 2011-10-05 NOTE — Progress Notes (Signed)
Report called to Alcario Drought, nurse at [facility] to give updates about the patient. Patient then transferred to [facility] via [facility] with no signs of acute distress.

## 2011-10-08 LAB — GLUCOSE, CAPILLARY: Glucose-Capillary: 96 mg/dL (ref 70–99)

## 2011-10-19 NOTE — Plan of Care (Signed)
Problem: Discharge Progression Outcomes Goal: Discharge plan in place and appropriate Outcome: Adequate for Discharge Discharged to [facility] Goal: Other Discharge Outcomes/Goals Outcome: Adequate for Discharge CBG levels controlled at time of discharge

## 2012-05-28 ENCOUNTER — Other Ambulatory Visit: Payer: Self-pay | Admitting: *Deleted

## 2012-05-28 MED ORDER — LORAZEPAM 0.5 MG PO TABS: ORAL_TABLET | ORAL | Status: DC

## 2012-05-28 MED ORDER — HYDROCODONE-ACETAMINOPHEN 5-325 MG PO TABS: ORAL_TABLET | ORAL | Status: DC

## 2012-06-24 ENCOUNTER — Non-Acute Institutional Stay (SKILLED_NURSING_FACILITY): Payer: Medicare Other | Admitting: Nurse Practitioner

## 2012-06-24 ENCOUNTER — Encounter: Payer: Self-pay | Admitting: Nurse Practitioner

## 2012-06-24 DIAGNOSIS — E538 Deficiency of other specified B group vitamins: Secondary | ICD-10-CM

## 2012-06-24 DIAGNOSIS — R7989 Other specified abnormal findings of blood chemistry: Secondary | ICD-10-CM

## 2012-06-24 DIAGNOSIS — F0390 Unspecified dementia without behavioral disturbance: Secondary | ICD-10-CM

## 2012-06-24 DIAGNOSIS — R946 Abnormal results of thyroid function studies: Secondary | ICD-10-CM

## 2012-06-24 DIAGNOSIS — I10 Essential (primary) hypertension: Secondary | ICD-10-CM

## 2012-06-24 NOTE — Assessment & Plan Note (Signed)
Patient is stable; continue current regimen. Will monitor and make changes as necessary. Will get bmp

## 2012-06-24 NOTE — Assessment & Plan Note (Signed)
Will follow up cbc 

## 2012-06-24 NOTE — Assessment & Plan Note (Signed)
TSH low in aug 2013- will reck TSH at this time

## 2012-06-24 NOTE — Assessment & Plan Note (Signed)
Stable-Will change BID namenda to once daily nameda XR 28mg 

## 2012-07-03 NOTE — Progress Notes (Signed)
Patient ID: Katie Gibson, female   DOB: Jun 25, 1910, 79 y.o.   MRN: [id number]  Chief Complaint: medical management of chronic conditions  HPI:  Pt is a 79 year old female who is a long term resident of [facility] seen today for a routine visit. Pt with hx of hyperlipidemia, anemia, anxiety, alzheimer's, htn, OA and constipation. Pt with advanced dementia and unable to provide much history; staff without any concerns at this time REASSESSMENT OF ONGOING PROBLEMS:   272.4-HYPERLIPIDEMIA The patient's most recent LDL is at goal.No complications noted from the medication presently being used. on zocor 10mg  daily 281.1-OTHER VITAMIN B12 DEFICIENCY ANEMIA  stable; takes vit b12 1000 mcg daily 300.00-ANXIETY The anxiety has not changed.No complications noted from the medication presently being used. has ativan 0.5 mg every 8 hours as needed 331.0-ALZHEIMER'S The Alzheimer's disease has not changed.No complications noted from the medication presently being used. The patient has had little change in behavior.is taking aricept 10 mg daily takes namenda 10mg  PO twice daily  401.9-HTN UNSPECIFIED The blood pressure readings taken outside the office since the last visit have been in the target range. No complications noted from the medication presently being used. takes norvasc 5 mg daily; takes asa 81 mg daily  564.00-CONSTIPATION The symptoms are stable.The medication is well tolerated. No other therapies have been tried.takes senna s 2 tabs daily  715.90-OSTEOARTHRITIS The arthritis is unchanged.The patient has mild joint pain.No complications noted from the medication presently being used.takes hydrocodone apap 5/325every 4 hours as needed and takes ultram 100 mg twice daily    Review of Systems:   unable to do ROS due to dementia   Medications: Patient's Medications  New Prescriptions   No medications on file  Previous Medications   AMLODIPINE (NORVASC) 5 MG TABLET    Take 1 tablet (5 mg total) by  mouth daily.   ASPIRIN EC 81 MG TABLET    Take 81 mg by mouth daily.   BISACODYL (DULCOLAX) 10 MG SUPPOSITORY    Place 10 mg rectally daily as needed. For constipation if mom does not work   CYANOCOBALAMIN (VITAMIN B 12 PO)    Take 1,000 mg by mouth daily.   DONEPEZIL (ARICEPT) 10 MG TABLET    Take 10 mg by mouth at bedtime.   LORAZEPAM (ATIVAN) 0.5 MG TABLET    Take one tablet every 6 hours as needed   MAGNESIUM HYDROXIDE (MILK OF MAGNESIA) 400 MG/5ML SUSPENSION    Take 30 mLs by mouth daily as needed. For constipation   MEMANTINE (NAMENDA) 10 MG TABLET    Take 10 mg by mouth 2 (two) times daily.   SENNA-DOCUSATE (SENOKOT-S) 8.6-50 MG PER TABLET    Take 2 tablets by mouth daily.   SIMVASTATIN (ZOCOR) 10 MG TABLET    Take 1 tablet (10 mg total) by mouth daily at 6 PM.   SODIUM PHOSPHATES (RA SALINE ENEMA) 19-7 GM/118ML ENEM    Place 1 enema rectally daily as needed. For constipation when mom, and bisacodyl supp does not work   TRAMADOL (ULTRAM) 50 MG TABLET    Take 2 tablets (100 mg total) by mouth 2 (two) times daily. For generalized pain  Modified Medications   No medications on file  Discontinued Medications   HYDROCODONE-ACETAMINOPHEN (NORCO/VICODIN) 5-325 MG PER TABLET    Take 1 tablet every 4 hours as needed   HYDROCODONE-ACETAMINOPHEN (VICODIN) 5-500 MG PER TABLET    Take 1 tablet by mouth every 4 (four) hours as needed.  For pain     Physical Exam:   Filed Vitals:   06/24/12 1043  BP: 134/75  Pulse: 75  Temp: 97.5 F (36.4 C)  Resp: 20  SpO2: 94%   Physical Exam  Nursing note and vitals reviewed. Constitutional: She is oriented to person, place, and time. She appears well-developed. No distress.  HENT:  Head: Normocephalic and atraumatic.  Eyes: Conjunctivae and EOM are normal. Pupils are equal, round, and reactive to light.  Neck: Normal range of motion. Neck supple.  Cardiovascular: Normal rate, regular rhythm and normal heart sounds.   Pulmonary/Chest: Effort normal  and breath sounds normal. No respiratory distress.  Abdominal: Soft. Bowel sounds are normal.  Musculoskeletal: Normal range of motion. She exhibits no edema and no tenderness.  Neurological: She is alert and oriented to person, place, and time.  Skin: Skin is warm and dry. She is not diaphoretic.     Labs reviewed: Basic Metabolic Panel:  Recent Labs  21/30/86 0415 10/04/11 0700 10/05/11 0700  NA 143 143 143  K 3.5 3.7 3.5  CL 107 107 106  CO2 24 22 24   GLUCOSE 66* 81 85  BUN 30* 15 13  CREATININE 0.97 0.78 0.68  CALCIUM 8.8 8.6 9.0    Liver Function Tests:  Recent Labs  10/02/11 1736 10/03/11 0415  AST 20 17  ALT 12 10  ALKPHOS 88 79  BILITOT 0.4 0.4  PROT 7.0 6.1  ALBUMIN 3.7 3.1*    CBC:  Recent Labs  10/02/11 1736 10/03/11 0620  WBC 10.7* 9.9  NEUTROABS 7.5 6.9  HGB 12.8 12.3  HCT 38.5 37.8  MCV 88.7 89.2  PLT 171 213      Assessment/Plan Low TSH level TSH low in aug 2013- will reck TSH at this time   Dementia Stable-Will change BID namenda to once daily nameda XR 28mg    B12 deficiency Will follow up cbc  Hypertension Patient is stable; continue current regimen. Will monitor and make changes as necessary. Will get bmp

## 2012-07-30 ENCOUNTER — Encounter: Payer: Self-pay | Admitting: Nurse Practitioner

## 2012-07-30 ENCOUNTER — Non-Acute Institutional Stay (SKILLED_NURSING_FACILITY): Payer: Medicare Other | Admitting: Nurse Practitioner

## 2012-07-30 DIAGNOSIS — N19 Unspecified kidney failure: Secondary | ICD-10-CM

## 2012-07-30 DIAGNOSIS — I10 Essential (primary) hypertension: Secondary | ICD-10-CM

## 2012-07-30 DIAGNOSIS — F0390 Unspecified dementia without behavioral disturbance: Secondary | ICD-10-CM

## 2012-07-30 DIAGNOSIS — E538 Deficiency of other specified B group vitamins: Secondary | ICD-10-CM

## 2012-07-30 NOTE — Assessment & Plan Note (Signed)
Patient is stable; continue current regimen. Will monitor and make changes as necessary.  

## 2012-07-30 NOTE — Assessment & Plan Note (Signed)
Renal function remains stable.  

## 2012-07-30 NOTE — Progress Notes (Signed)
Patient ID: Katie Gibson, female   DOB: 1910-04-08, 79 y.o.   MRN: [id number]  Nursing Home Location:  [facility]   Place of Service: SNF (31)   Chief Complaint: medical management of chronic conditions  HPI:  Pt is a 79 year old female who is a long term resident of [facility] seen today for a routine visit. Pt with hx of hyperlipidemia, anemia, anxiety, alzheimer's, htn, OA and constipation. Pt with advanced dementia and unable to provide much history; staff without any concerns at this time  REASSESSMENT OF ONGOING PROBLEMS:  272.4-HYPERLIPIDEMIA The patient's most recent LDL is at goal.No complications noted from the medication presently being used. on zocor 10mg  daily  281.1-OTHER VITAMIN B12 DEFICIENCY ANEMIA stable; takes vit b12 1000 mcg daily  300.00-ANXIETY The anxiety has not changed.No complications noted from the medication presently being used. has ativan 0.5 mg every 8 hours as needed  331.0-ALZHEIMER'S The Alzheimer's disease has not changed.No complications noted from the medication presently being used. The patient has had little change in behavior.is taking aricept 10 mg daily takes namenda 10mg  PO twice daily  401.9-HTN UNSPECIFIED The blood pressure readings taken outside the office since the last visit have been in the target range. No complications noted from the medication presently being used. takes norvasc 5 mg daily; takes asa 81 mg daily  564.00-CONSTIPATION The symptoms are stable.The medication is well tolerated. No other therapies have been tried.takes senna s 2 tabs daily  715.90-OSTEOARTHRITIS The arthritis is unchanged.The patient has mild joint pain.No complications noted from the medication presently being used.takes hydrocodone apap 5/325every 4 hours as needed and takes ultram 100 mg twice daily      Review of Systems:  Review of Systems  Constitutional: Negative for fever, chills and weight loss.  Respiratory: Negative for cough and  shortness of breath.   Cardiovascular: Negative for chest pain.  Gastrointestinal: Negative for abdominal pain, diarrhea and constipation.  Genitourinary: Negative for dysuria.  Musculoskeletal: Negative.   Neurological: Negative for weakness and headaches.    Medications: Patient's Medications  New Prescriptions   No medications on file  Previous Medications   AMLODIPINE (NORVASC) 5 MG TABLET    Take 1 tablet (5 mg total) by mouth daily.   ASPIRIN EC 81 MG TABLET    Take 81 mg by mouth daily.   BISACODYL (DULCOLAX) 10 MG SUPPOSITORY    Place 10 mg rectally daily as needed. For constipation if mom does not work   CYANOCOBALAMIN (VITAMIN B 12 PO)    Take 1,000 mg by mouth daily.   DONEPEZIL (ARICEPT) 10 MG TABLET    Take 10 mg by mouth at bedtime.   LORAZEPAM (ATIVAN) 0.5 MG TABLET    Take one tablet every 6 hours as needed   MAGNESIUM HYDROXIDE (MILK OF MAGNESIA) 400 MG/5ML SUSPENSION    Take 30 mLs by mouth daily as needed. For constipation   MEMANTINE HCL ER (NAMENDA XR) 28 MG CP24    Take by mouth.   SENNA-DOCUSATE (SENOKOT-S) 8.6-50 MG PER TABLET    Take 2 tablets by mouth daily.   SIMVASTATIN (ZOCOR) 10 MG TABLET    Take 1 tablet (10 mg total) by mouth daily at 6 PM.   SODIUM PHOSPHATES (RA SALINE ENEMA) 19-7 GM/118ML ENEM    Place 1 enema rectally daily as needed. For constipation when mom, and bisacodyl supp does not work   TRAMADOL (ULTRAM) 50 MG TABLET    Take 2 tablets (100  mg total) by mouth 2 (two) times daily. For generalized pain  Modified Medications   No medications on file  Discontinued Medications   MEMANTINE (NAMENDA) 10 MG TABLET    Take 10 mg by mouth 2 (two) times daily.     Physical Exam:  Filed Vitals:   07/30/12 1029  BP: 136/77  Pulse: 77  Temp: 97.8 F (36.6 C)  Resp: 18  Weight: 136 lb (61.689 kg)   Physical Exam  Nursing note and vitals reviewed. Constitutional: She is well-developed, well-nourished, and in no distress. No distress.  HENT:   Head: Normocephalic and atraumatic.  Mouth/Throat: Oropharynx is clear and moist.  Neck: Normal range of motion. Neck supple.  Cardiovascular: Normal rate, regular rhythm and normal heart sounds.   Pulmonary/Chest: Effort normal and breath sounds normal. No respiratory distress.  Abdominal: Soft. Bowel sounds are normal.  Musculoskeletal: Normal range of motion. She exhibits no edema and no tenderness.  Neurological: She is alert.  Skin: Skin is warm and dry. She is not diaphoretic.      Labs reviewed: Liver Profile       Result: 07/08/2012 4:15 AM    ( Status: F )       C     Bilirubin, Total  0.4        0.3-1.2  mg/dL  SLN       Bilirubin, Direct  0.1        0.0-0.3  mg/dL  SLN       Indirect Bilirubin  0.3        0.0-0.9  mg/dL  SLN       Alkaline Phosphatase  72        39-117  U/L  SLN       AST/SGOT  13        0-37  U/L  SLN       ALT/SGPT  <8        0-35  U/L  SLN       Total Protein  4.9     L  6.0-8.3  g/dL  SLN       Albumin  2.9     L  3.5-5.2  g/dL  SLN     CBC NO Diff (Complete Blood Count)       Result: 06/26/2012 5:23 PM    ( Status: F )            WBC  7.2        4.0-10.5  K/uL  SLN       RBC  4.21        3.87-5.11  MIL/uL  SLN       Hemoglobin  11.8     L  12.0-15.0  g/dL  SLN       Hematocrit  37.1        36.0-46.0  %  SLN       MCV  88.1        78.0-100.0  fL  SLN       MCH  28.0        26.0-34.0  pg  SLN       MCHC  31.8        30.0-36.0  g/dL  SLN       RDW  16.1        11.5-15.5  %  SLN       Platelet Count  215        150-400  K/uL  SLN      Basic Metabolic Panel       Result: 06/26/2012 6:13 PM    ( Status: F )            Sodium  143        135-145  mEq/L  SLN       Potassium  4.1        3.5-5.3  mEq/L  SLN       Chloride  109        96-112  mEq/L  SLN       CO2  26        19-32  mEq/L  SLN       Glucose  73        70-99  mg/dL  SLN       BUN  21        6-23  mg/dL  SLN       Creatinine  0.82        0.50-1.10  mg/dL  SLN       Calcium  8.2     L   8.4-10.5  mg/dL  SLN      TSH, Ultrasensitive       Result: 06/26/2012 4:09 PM    ( Status: F )            TSH  0.917        0.350-4.500          Assessment/Plan Hypertension Patient is stable; continue current regimen. Will monitor and make changes as necessary.   Dementia Doing well in current setting. Currently on namenda xr and aricept   B12 deficiency Stable.   Renal failure Renal function remains stable.

## 2012-07-30 NOTE — Assessment & Plan Note (Signed)
Stable

## 2012-07-30 NOTE — Assessment & Plan Note (Signed)
Doing well in current setting. Currently on namenda xr and aricept

## 2012-09-01 ENCOUNTER — Non-Acute Institutional Stay (SKILLED_NURSING_FACILITY): Payer: Medicare Other | Admitting: Nurse Practitioner

## 2012-09-01 DIAGNOSIS — M1990 Unspecified osteoarthritis, unspecified site: Secondary | ICD-10-CM

## 2012-09-01 DIAGNOSIS — I10 Essential (primary) hypertension: Secondary | ICD-10-CM

## 2012-09-01 DIAGNOSIS — E538 Deficiency of other specified B group vitamins: Secondary | ICD-10-CM

## 2012-09-01 NOTE — Progress Notes (Signed)
Patient ID: Katie Gibson, female   DOB: 1910-10-26, 79 y.o.   MRN: [id number]  Nursing Home Location:  [facility]   Place of Service: SNF (31)  Chief Complaint  Patient presents with  . Medical Managment of Chronic Issues    HPI:  Pt is a 79 year old female who is a long term resident of [facility] seen today for a routine visit. Pt with hx of hyperlipidemia, anemia, anxiety, alzheimer's, htn, OA and constipation. Pt with advanced dementia and unable to provide much history; staff without any concerns at this time  REASSESSMENT OF ONGOING PROBLEMS:  HYPERLIPIDEMIA The patient's most recent LDL is at goal.No complications noted from the medication presently being used. on zocor 10mg  daily  OTHER VITAMIN B12 DEFICIENCY ANEMIA stable; takes vit b12 1000 mcg daily  ANXIETY The anxiety has not changed.No complications noted from the medication presently being used. has ativan 0.5 mg every 8 hours as needed  ALZHEIMER'S The Alzheimer's disease has not changed.No complications noted from the medication presently being used. The patient has had little change in behavior pt is taking aricept 10 mg daily takes namenda 10mg  PO twice daily  HTN UNSPECIFIED The blood pressure readings taken outside the office since the last visit have been in the target range. No complications noted from the medication presently being used. takes norvasc 5 mg daily; takes asa 81 mg daily  CONSTIPATION The symptoms are stable.The medication is well tolerated. No other therapies have been tried.takes senna s 2 tabs daily  OSTEOARTHRITIS The arthritis is unchanged.The patient has mild joint pain.No complications noted from the medication presently being used.takes hydrocodone apap 5/325every 4 hours as needed and takes ultram 100 mg twice daily      Review of Systems:  DATA OBTAINED: from patient, nurse, medical record GENERAL: Feels well no fevers, fatigue, appetite changes SKIN: No itching, rash or  wounds MOUTH/THROAT: No mouth or tooth pain, No sore throat, No difficulty chewing or swallowing  RESPIRATORY: No cough, wheezing, SOB CARDIAC: No chest pain, palpitations, lower extremity edema  GI: No abdominal pain, No N/V/D or constipation GU: No dysuria, frequency or urgency  MUSCULOSKELETAL: No unrelieved bone/joint pain NEUROLOGIC: Awake, alert, appropriate to situation, No change in mental status. Moves all four, no focal deficits PSYCHIATRIC: No overt anxiety or sadness. Sleeps well. No behavior issue.  AMBULATION:  Self with minimal assist from walkers and wheelchair  Medications: Patient's Medications  New Prescriptions   No medications on file  Previous Medications   AMLODIPINE (NORVASC) 5 MG TABLET    Take 1 tablet (5 mg total) by mouth daily.   ASPIRIN EC 81 MG TABLET    Take 81 mg by mouth daily.   BISACODYL (DULCOLAX) 10 MG SUPPOSITORY    Place 10 mg rectally daily as needed. For constipation if mom does not work   CYANOCOBALAMIN (VITAMIN B 12 PO)    Take 1,000 mg by mouth daily.   DONEPEZIL (ARICEPT) 10 MG TABLET    Take 10 mg by mouth at bedtime.   LORAZEPAM (ATIVAN) 0.5 MG TABLET    Take one tablet every 6 hours as needed   MAGNESIUM HYDROXIDE (MILK OF MAGNESIA) 400 MG/5ML SUSPENSION    Take 30 mLs by mouth daily as needed. For constipation   MEMANTINE HCL ER (NAMENDA XR) 28 MG CP24    Take by mouth.   SENNA-DOCUSATE (SENOKOT-S) 8.6-50 MG PER TABLET    Take 2 tablets by mouth daily.   SIMVASTATIN (  ZOCOR) 10 MG TABLET    Take 1 tablet (10 mg total) by mouth daily at 6 PM.   SODIUM PHOSPHATES (RA SALINE ENEMA) 19-7 GM/118ML ENEM    Place 1 enema rectally daily as needed. For constipation when mom, and bisacodyl supp does not work   TRAMADOL (ULTRAM) 50 MG TABLET    Take 2 tablets (100 mg total) by mouth 2 (two) times daily. For generalized pain  Modified Medications   No medications on file  Discontinued Medications   No medications on file     Physical  Exam:  Filed Vitals:   09/01/12 1244  BP: 111/59  Pulse: 75  Resp: 20  SpO2: 97%    GENERAL APPEARANCE: Alert, conversant. Appropriately groomed. No acute distress.  SKIN: No diaphoresis rash, or wounds HEAD: Normocephalic, atraumatic  EYES: Conjunctiva/lids clear. Pupils round, reactive. EOMs intact.  EARS: External exam WNL. Hearing grossly normal.  NOSE: No deformity or discharge.  MOUTH/THROAT: Lips w/o lesions. Mouth and throat normal. Tongue moist, w/o lesion.  NECK: No thyroid tenderness, enlargement or nodule  RESPIRATORY: Breathing is even, unlabored. Lung sounds are clear   CARDIOVASCULAR: Heart RRR no murmurs, rubs or gallops. No peripheral edema.  ARTERIAL: radial pulse 2+, DP pulse 1+  GASTROINTESTINAL: Abdomen is soft, non-tender, not distended w/ normal bowel sounds. GENITOURINARY: Bladder non tender, not distended  MUSCULOSKELETAL: No abnormal joints or musculature NEUROLOGIC: Oriented to self only. Cranial nerves 2-12 grossly intact. Moves all extremities no tremor. PSYCHIATRIC: Mood and affect appropriate to situation, no behavioral issues   Labs reviewed/Significant Diagnostic Results: Liver Profile  Result: 07/08/2012 4:15 AM ( Status: F ) C  Bilirubin, Total 0.4 0.3-1.2 mg/dL SLN  Bilirubin, Direct 0.1 0.0-0.3 mg/dL SLN  Indirect Bilirubin 0.3 0.0-0.9 mg/dL SLN  Alkaline Phosphatase 72 39-117 U/L SLN  AST/SGOT 13 0-37 U/L SLN  ALT/SGPT <8 0-35 U/L SLN  Total Protein 4.9 L 6.0-8.3 g/dL SLN  Albumin 2.9 L 1.6-1.0 g/dL SLN  CBC NO Diff (Complete Blood Count)  Result: 06/26/2012 5:23 PM ( Status: F )  WBC 7.2 4.0-10.5 K/uL SLN  RBC 4.21 3.87-5.11 MIL/uL SLN  Hemoglobin 11.8 L 12.0-15.0 g/dL SLN  Hematocrit 96.0 45.4-09.8 % SLN  MCV 88.1 78.0-100.0 fL SLN  MCH 28.0 26.0-34.0 pg SLN  MCHC 31.8 30.0-36.0 g/dL SLN  RDW 11.9 14.7-82.9 % SLN  Platelet Count 215 150-400 K/uL SLN  Basic Metabolic Panel  Result: 06/26/2012 6:13 PM ( Status: F )  Sodium 143  135-145 mEq/L SLN  Potassium 4.1 3.5-5.3 mEq/L SLN  Chloride 109 96-112 mEq/L SLN  CO2 26 19-32 mEq/L SLN  Glucose 73 70-99 mg/dL SLN  BUN 21 5-62 mg/dL SLN  Creatinine 1.30 8.65-7.84 mg/dL SLN  Calcium 8.2 L 6.9-62.9 mg/dL SLN  TSH, Ultrasensitive  Result: 06/26/2012 4:09 PM ( Status: F )  TSH 0.917 0.350-4.500    Assessment/Plan  1. Hypertension Stable; cont current medication will make changes as needed   2. B12 deficiency Patients cbc is stable; continue current regimen. Will monitor and make changes as necessary.  3. Osteoarthritis Pt not using HC/APAP will dc this medication from [facility] and she has ultram PRN pain

## 2012-09-30 ENCOUNTER — Encounter: Payer: Self-pay | Admitting: Nurse Practitioner

## 2012-09-30 ENCOUNTER — Non-Acute Institutional Stay (SKILLED_NURSING_FACILITY): Payer: Medicare Other | Admitting: Nurse Practitioner

## 2012-09-30 DIAGNOSIS — K5900 Constipation, unspecified: Secondary | ICD-10-CM

## 2012-09-30 DIAGNOSIS — I6350 Cerebral infarction due to unspecified occlusion or stenosis of unspecified cerebral artery: Secondary | ICD-10-CM

## 2012-09-30 DIAGNOSIS — F0390 Unspecified dementia without behavioral disturbance: Secondary | ICD-10-CM

## 2012-09-30 DIAGNOSIS — I639 Cerebral infarction, unspecified: Secondary | ICD-10-CM

## 2012-09-30 DIAGNOSIS — I10 Essential (primary) hypertension: Secondary | ICD-10-CM

## 2012-09-30 NOTE — Progress Notes (Signed)
Patient ID: Katie Gibson, female   DOB: 20-Jun-1910, 79 y.o.   MRN: [id number]  Nursing Home Location:  [facility]   Place of Service: SNF (31)  Chief Complaint  Patient presents with  . Medical Managment of Chronic Issues    HPI:  Pt is a 79 year old female who is a long term resident of [facility] seen today for a routine visit. Pt with hx of hyperlipidemia, anemia, anxiety, alzheimer's, htn, OA and constipation. Pt with dementia does not have any complaints and staff without any concerns at this time  REASSESSMENT OF ONGOING PROBLEMS:  HYPERLIPIDEMIA The patient's most recent LDL is at goal.No complications noted from the medication presently being used. on zocor 10mg  daily  OTHER VITAMIN B12 DEFICIENCY ANEMIA stable; takes vit b12 1000 mcg daily  ANXIETY The anxiety has not changed.No complications noted from the medication presently being used. has ativan 0.5 mg every 8 hours as needed  ALZHEIMER'S The Alzheimer's disease has not changed.No complications noted from the medication presently being used. The patient has had little change in behavior pt is taking aricept 10 mg daily takes namenda  daily  HTN UNSPECIFIED The blood pressure readings taken outside the office since the last visit have been in the target range. No complications noted from the medication presently being used. takes norvasc 5 mg daily; takes asa 81 mg daily  CONSTIPATION The symptoms are stable.The medication is well tolerated.takes senna s 2 tabs daily  OSTEOARTHRITIS The patient has mild joint pain.No complications noted from the medication presently being used.  takes ultram 100 mg twice daily as needed   Review of Systems:  ROS limited due to dementia however DATA OBTAINED: from patient, nurse, medical record  GENERAL: Feels well no fevers, fatigue, appetite changes  SKIN: No itching, rash or wounds  MOUTH/THROAT: No difficulty chewing or swallowing  RESPIRATORY: No cough, wheezing, SOB   CARDIAC: No chest pain, palpitations, lower extremity edema  GI: No abdominal pain, No N/V/D or constipation  GU:has  incontinence  MUSCULOSKELETAL: No unrelieved bone/joint pain  NEUROLOGIC: Awake, alert, appropriate to situation, No change in mental status. Moves all four, no focal deficits  PSYCHIATRIC: No overt anxiety or sadness. Sleeps well. No behavior issue.  AMBULATION: Self with minimal assist from walkers and wheelchair    Medications: Patient's Medications  New Prescriptions   No medications on file  Previous Medications   AMLODIPINE (NORVASC) 5 MG TABLET    Take 1 tablet (5 mg total) by mouth daily.   ASPIRIN EC 81 MG TABLET    Take 81 mg by mouth daily.   BISACODYL (DULCOLAX) 10 MG SUPPOSITORY    Place 10 mg rectally daily as needed. For constipation if mom does not work   CYANOCOBALAMIN (VITAMIN B 12 PO)    Take 1,000 mg by mouth daily.   DONEPEZIL (ARICEPT) 10 MG TABLET    Take 10 mg by mouth at bedtime.   LORAZEPAM (ATIVAN) 0.5 MG TABLET    Take one tablet every 6 hours as needed   MAGNESIUM HYDROXIDE (MILK OF MAGNESIA) 400 MG/5ML SUSPENSION    Take 30 mLs by mouth daily as needed. For constipation   MEMANTINE HCL ER (NAMENDA XR) 28 MG CP24    Take by mouth.   SENNA-DOCUSATE (SENOKOT-S) 8.6-50 MG PER TABLET    Take 2 tablets by mouth daily.   SIMVASTATIN (ZOCOR) 10 MG TABLET    Take 1 tablet (10 mg total) by mouth daily at  6 PM.   SODIUM PHOSPHATES (RA SALINE ENEMA) 19-7 GM/118ML ENEM    Place 1 enema rectally daily as needed. For constipation when mom, and bisacodyl supp does not work   TRAMADOL (ULTRAM) 50 MG TABLET    Take 2 tablets (100 mg total) by mouth 2 (two) times daily. For generalized pain  Modified Medications   No medications on file  Discontinued Medications   No medications on file     Physical Exam:  Filed Vitals:   09/30/12 1527  BP: 120/50  Pulse: 60  Temp: 97.8 F (36.6 C)  Resp: 18      GENERAL APPEARANCE: Alert, conversant.  Appropriately groomed. No acute distress.  SKIN: No diaphoresis rash, or wounds HEAD: Normocephalic, atraumatic  EYES: Conjunctiva/lids clear. Pupils round, reactive. EOMs intact.  EARS: External exam WNL. Hearing grossly normal.  NOSE: No deformity or discharge.  MOUTH/THROAT: Lips w/o lesions. Mouth and throat normal. Tongue moist, w/o lesion.  NECK: No thyroid tenderness, enlargement or nodule  RESPIRATORY: Breathing is even, unlabored. Lung sounds are clear   CARDIOVASCULAR: Heart RRR  Has murmur. No peripheral edema.  ARTERIAL: radial pulse 2+, DP pulse 1+  VENOUS: No varicosities. No venous stasis skin changes  GASTROINTESTINAL: Abdomen is soft, non-tender, not distended w/ normal bowel sounds. GENITOURINARY: Bladder non tender, not distended  MUSCULOSKELETAL: No abnormal joints or musculature NEUROLOGIC: Oriented to self. Cranial nerves 2-12 grossly intact. Moves all extremities no tremor. PSYCHIATRIC: Mood and affect appropriate to situation, no behavioral issues  Labs reviewed/Significant Diagnostic Results: Liver Profile  Result: 07/08/2012 4:15 AM ( Status: F ) C  Bilirubin, Total 0.4 0.3-1.2 mg/dL SLN  Bilirubin, Direct 0.1 0.0-0.3 mg/dL SLN  Indirect Bilirubin 0.3 0.0-0.9 mg/dL SLN  Alkaline Phosphatase 72 39-117 U/L SLN  AST/SGOT 13 0-37 U/L SLN  ALT/SGPT <8 0-35 U/L SLN  Total Protein 4.9 L 6.0-8.3 g/dL SLN  Albumin 2.9 L 4.0-9.8 g/dL SLN  CBC NO Diff (Complete Blood Count)  Result: 06/26/2012 5:23 PM ( Status: F )  WBC 7.2 4.0-10.5 K/uL SLN  RBC 4.21 3.87-5.11 MIL/uL SLN  Hemoglobin 11.8 L 12.0-15.0 g/dL SLN  Hematocrit 11.9 14.7-82.9 % SLN  MCV 88.1 78.0-100.0 fL SLN  MCH 28.0 26.0-34.0 pg SLN  MCHC 31.8 30.0-36.0 g/dL SLN  RDW 56.2 13.0-86.5 % SLN  Platelet Count 215 150-400 K/uL SLN  Basic Metabolic Panel  Result: 06/26/2012 6:13 PM ( Status: F )  Sodium 143 135-145 mEq/L SLN  Potassium 4.1 3.5-5.3 mEq/L SLN  Chloride 109 96-112 mEq/L SLN  CO2 26 19-32 mEq/L  SLN  Glucose 73 70-99 mg/dL SLN  BUN 21 7-84 mg/dL SLN  Creatinine 6.96 2.95-2.84 mg/dL SLN  Calcium 8.2 L 1.3-24.4 mg/dL SLN  TSH, Ultrasensitive  Result: 06/26/2012 4:09 PM ( Status: F )  TSH 0.917 0.350-4.500     Assessment/Plan 1. CVA (cerebral infarction) Patients neurological status  is stable; continue current regimen  2. Dementia, without behavioral disturbance Stable in current living situation  3. Hypertension Stable on current medication.  4. Constipation Stable on current medication

## 2012-10-06 ENCOUNTER — Other Ambulatory Visit: Payer: Self-pay | Admitting: Geriatric Medicine

## 2012-10-06 MED ORDER — TRAMADOL HCL 50 MG PO TABS: ORAL_TABLET | ORAL | Status: DC

## 2012-10-28 ENCOUNTER — Non-Acute Institutional Stay (SKILLED_NURSING_FACILITY): Payer: Medicare Other | Admitting: Nurse Practitioner

## 2012-10-28 DIAGNOSIS — I639 Cerebral infarction, unspecified: Secondary | ICD-10-CM

## 2012-10-28 DIAGNOSIS — I10 Essential (primary) hypertension: Secondary | ICD-10-CM

## 2012-10-28 DIAGNOSIS — F0390 Unspecified dementia without behavioral disturbance: Secondary | ICD-10-CM

## 2012-10-28 DIAGNOSIS — K5900 Constipation, unspecified: Secondary | ICD-10-CM

## 2012-10-28 DIAGNOSIS — N19 Unspecified kidney failure: Secondary | ICD-10-CM

## 2012-10-28 DIAGNOSIS — I6350 Cerebral infarction due to unspecified occlusion or stenosis of unspecified cerebral artery: Secondary | ICD-10-CM

## 2012-10-28 DIAGNOSIS — E538 Deficiency of other specified B group vitamins: Secondary | ICD-10-CM

## 2012-10-28 NOTE — Progress Notes (Signed)
Patient ID: Katie Gibson, female   DOB: 1910-05-03, 79 y.o.   MRN: [id number]  Nursing Home Location:  [facility]   Place of Service: SNF (31)  No chief complaint on file.   HPI:  Pt is an 79 year old female who is a long term resident of [facility] seen today for a routine visit. Pt with hx of hyperlipidemia, anemia, anxiety, alzheimer's, htn, OA and constipation. Pt with dementia does not have any complaints and staff without any concerns at this time  REASSESSMENT OF ONGOING PROBLEMS:  HYPERLIPIDEMIA The patient's most recent LDL is at goal.No complications noted from the medication presently being used. on zocor 10mg  daily  OTHER VITAMIN B12 DEFICIENCY ANEMIA stable; takes vit b12 1000 mcg daily  ANXIETY The anxiety has not changed.No complications noted from the medication presently being used. has ativan 0.5 mg every 8 hours as needed  ALZHEIMER'S The Alzheimer's disease has not changed.No complications noted from the medication presently being used. The patient has had little change in behavior pt is taking aricept 10 mg daily takes namenda daily  HTN UNSPECIFIED The blood pressure readings taken outside the office since the last visit have been in the target range. No complications noted from the medication presently being used. takes norvasc 5 mg daily; takes asa 81 mg daily  CONSTIPATION The symptoms are stable.The medication is well tolerated.takes senna s 2 tabs daily  OSTEOARTHRITIS The patient has mild joint pain.No complications noted from the medication presently being used. takes ultram 50 mg twice daily scheduled and has as needed if needed    Review of Systems:  Unable to obtain ROS   Medications: Patient's Medications  New Prescriptions   No medications on file  Previous Medications   AMLODIPINE (NORVASC) 5 MG TABLET    Take 1 tablet (5 mg total) by mouth daily.   ASPIRIN EC 81 MG TABLET    Take 81 mg by mouth daily.   BISACODYL (DULCOLAX) 10 MG  SUPPOSITORY    Place 10 mg rectally daily as needed. For constipation if mom does not work   CYANOCOBALAMIN (VITAMIN B 12 PO)    Take 1,000 mg by mouth daily.   DONEPEZIL (ARICEPT) 10 MG TABLET    Take 10 mg by mouth at bedtime.   LORAZEPAM (ATIVAN) 0.5 MG TABLET    Take one tablet every 6 hours as needed   MAGNESIUM HYDROXIDE (MILK OF MAGNESIA) 400 MG/5ML SUSPENSION    Take 30 mLs by mouth daily as needed. For constipation   MEMANTINE HCL ER (NAMENDA XR) 28 MG CP24    Take by mouth.   SENNA-DOCUSATE (SENOKOT-S) 8.6-50 MG PER TABLET    Take 2 tablets by mouth daily.   SIMVASTATIN (ZOCOR) 10 MG TABLET    Take 1 tablet (10 mg total) by mouth daily at 6 PM.   SODIUM PHOSPHATES (RA SALINE ENEMA) 19-7 GM/118ML ENEM    Place 1 enema rectally daily as needed. For constipation when mom, and bisacodyl supp does not work   TRAMADOL (ULTRAM) 50 MG TABLET    Take 2 tablets by mouth twice daily; 1 tablet by mouth daily as needed for pain.  Modified Medications   No medications on file  Discontinued Medications   No medications on file     Physical Exam:  Filed Vitals:   10/28/12 1640  BP: 135/72  Pulse: 68  Temp: 97.5 F (36.4 C)  Resp: 20  Weight: 135 lb 3.2 oz (61.326 kg)  GENERAL APPEARANCE: Alert, conversant. Appropriately groomed. No acute distress.  SKIN: No diaphoresis rash, or wounds  HEAD: Normocephalic, atraumatic  EYES: Conjunctiva/lids clear. Pupils round, reactive. EOMs intact.  EARS: External exam WNL. Hearing grossly normal.  NOSE: No deformity or discharge.  MOUTH/THROAT:mouth and throat WNL NECK: No thyroid tenderness, enlargement or nodule  RESPIRATORY: Breathing is even, unlabored. Lung sounds are clear  CARDIOVASCULAR: Heart RRR Has murmur. No peripheral edema.  ARTERIAL: radial pulse 2+ GASTROINTESTINAL: Abdomen is soft, non-tender, not distended w/ normal bowel sounds. GENITOURINARY: Bladder non tender, not distended  MUSCULOSKELETAL: No abnormal joints or  musculature  NEUROLOGIC: Oriented to self.  Moves all extremities no tremor.  PSYCHIATRIC: Mood and affect appropriate to situation, no behavioral issues   Labs reviewed/Significant Diagnostic Results: Liver Profile  Result: 07/08/2012 4:15 AM ( Status: F ) C  Bilirubin, Total 0.4 0.3-1.2 mg/dL SLN  Bilirubin, Direct 0.1 0.0-0.3 mg/dL SLN  Indirect Bilirubin 0.3 0.0-0.9 mg/dL SLN  Alkaline Phosphatase 72 39-117 U/L SLN  AST/SGOT 13 0-37 U/L SLN  ALT/SGPT <8 0-35 U/L SLN  Total Protein 4.9 L 6.0-8.3 g/dL SLN  Albumin 2.9 L 6.9-6.2 g/dL SLN  CBC NO Diff (Complete Blood Count)  Result: 06/26/2012 5:23 PM ( Status: F )  WBC 7.2 4.0-10.5 K/uL SLN  RBC 4.21 3.87-5.11 MIL/uL SLN  Hemoglobin 11.8 L 12.0-15.0 g/dL SLN  Hematocrit 95.2 84.1-32.4 % SLN  MCV 88.1 78.0-100.0 fL SLN  MCH 28.0 26.0-34.0 pg SLN  MCHC 31.8 30.0-36.0 g/dL SLN  RDW 40.1 02.7-25.3 % SLN  Platelet Count 215 150-400 K/uL SLN  Basic Metabolic Panel  Result: 06/26/2012 6:13 PM ( Status: F )  Sodium 143 135-145 mEq/L SLN  Potassium 4.1 3.5-5.3 mEq/L SLN  Chloride 109 96-112 mEq/L SLN  CO2 26 19-32 mEq/L SLN  Glucose 73 70-99 mg/dL SLN  BUN 21 6-64 mg/dL SLN  Creatinine 4.03 4.74-2.59 mg/dL SLN  Calcium 8.2 L 5.6-38.7 mg/dL SLN  TSH, Ultrasensitive  Result: 06/26/2012 4:09 PM ( Status: F )  TSH 0.917 0.350-4.500      Assessment/Plan  1. B12 deficiency Will follow up cbc  2. Dementia Remains unchanged; conts to function in current setting  3. CVA (cerebral infarction) Stable; will cont ASA  4. Renal failure Will follow up lab work  5. Hypertension Patient is stable; continue current regimen. Will monitor and make changes as necessary.  6. Constipation Stable on current regimen

## 2012-11-09 DIAGNOSIS — K5900 Constipation, unspecified: Secondary | ICD-10-CM | POA: Insufficient documentation

## 2012-11-25 ENCOUNTER — Non-Acute Institutional Stay (SKILLED_NURSING_FACILITY): Payer: Medicare Other | Admitting: Nurse Practitioner

## 2012-11-25 ENCOUNTER — Encounter: Payer: Self-pay | Admitting: Nurse Practitioner

## 2012-11-25 DIAGNOSIS — I10 Essential (primary) hypertension: Secondary | ICD-10-CM

## 2012-11-25 DIAGNOSIS — F0390 Unspecified dementia without behavioral disturbance: Secondary | ICD-10-CM

## 2012-11-25 DIAGNOSIS — E538 Deficiency of other specified B group vitamins: Secondary | ICD-10-CM

## 2012-11-25 DIAGNOSIS — N19 Unspecified kidney failure: Secondary | ICD-10-CM

## 2012-11-25 NOTE — Progress Notes (Signed)
Patient ID: Katie Gibson, female   DOB: November 30, 1910, 79 y.o.   MRN: [id number]   PCP: [name], MD  Code Status: FULL  No Known Allergies  Chief Complaint  Patient presents with  . Medical Managment of Chronic Issues    HPI:  Pt is an 79 year old female who is a long term resident of [facility] seen today for a routine visit. Pt with hx of hyperlipidemia, anemia, anxiety, alzheimer's, htn, OA and constipation. Pt with dementia does not have any complaints and staff without any concerns at this time  REASSESSMENT OF ONGOING PROBLEMS:  HYPERLIPIDEMIA The patient's most recent LDL is at goal.No complications noted from the medication presently being used. on zocor 10mg  daily  OTHER VITAMIN B12 DEFICIENCY ANEMIA stable; takes vit b12 1000 mcg daily  ANXIETY The anxiety has not changed.No complications noted from the medication presently being used. has ativan 0.5 mg every 8 hours as needed  ALZHEIMER'S The Alzheimer's disease has not changed.No complications noted from the medication presently being used. The patient has had little change in behavior pt is taking aricept 10 mg daily takes namenda daily  HTN UNSPECIFIED. No complications noted from the medication presently being used. takes norvasc 5 mg daily; takes asa 81 mg daily  CONSTIPATION The symptoms are stable.The medication is well tolerated.takes senna s 2 tabs daily  OSTEOARTHRITIS The patient has mild joint pain.No complications noted from the medication presently being used. takes ultram 50 mg twice daily scheduled and has as needed if needed    Review of Systems:  Unable to obtain due to dementia  Past Medical History  Diagnosis Date  . Dementia   . DVT (deep venous thrombosis)   . Hypertension   . Hyperlipidemia   . Depression   . GERD (gastroesophageal reflux disease)   . Coronary artery disease    Past Surgical History  Procedure Laterality Date  . Eye surgery    . Cataract extractions with lens  implantation     Social History:   reports that she has never smoked. She does not have any smokeless tobacco history on file. She reports that she does not drink alcohol or use illicit drugs.  Family History  Problem Relation Age of Onset  . Stroke Daughter     Medications: Patient's Medications  New Prescriptions   No medications on file  Previous Medications   AMLODIPINE (NORVASC) 5 MG TABLET    Take 1 tablet (5 mg total) by mouth daily.   ASPIRIN EC 81 MG TABLET    Take 81 mg by mouth daily.   BISACODYL (DULCOLAX) 10 MG SUPPOSITORY    Place 10 mg rectally daily as needed. For constipation if mom does not work   CYANOCOBALAMIN (VITAMIN B 12 PO)    Take 1,000 mg by mouth daily.   DONEPEZIL (ARICEPT) 10 MG TABLET    Take 10 mg by mouth at bedtime.   LORAZEPAM (ATIVAN) 0.5 MG TABLET    Take one tablet every 6 hours as needed   MAGNESIUM HYDROXIDE (MILK OF MAGNESIA) 400 MG/5ML SUSPENSION    Take 30 mLs by mouth daily as needed. For constipation   MEMANTINE HCL ER (NAMENDA XR) 28 MG CP24    Take by mouth.   SENNA-DOCUSATE (SENOKOT-S) 8.6-50 MG PER TABLET    Take 2 tablets by mouth daily.   SIMVASTATIN (ZOCOR) 10 MG TABLET    Take 1 tablet (10 mg total) by mouth daily at 6 PM.  SODIUM PHOSPHATES (RA SALINE ENEMA) 19-7 GM/118ML ENEM    Place 1 enema rectally daily as needed. For constipation when mom, and bisacodyl supp does not work   TRAMADOL (ULTRAM) 50 MG TABLET    Take 2 tablets by mouth twice daily; 1 tablet by mouth daily as needed for pain.  Modified Medications   No medications on file  Discontinued Medications   No medications on file     Physical Exam: GENERAL APPEARANCE: Alert, conversant. Appropriately groomed. No acute distress.  SKIN: No diaphoresis rash, or wounds  HEENT: unremarkable NECK: No thyroid tenderness, enlargement or nodule  RESPIRATORY: Breathing is even, unlabored. Lung sounds are clear  CARDIOVASCULAR: Heart RRR Has murmur. No peripheral edema.   ARTERIAL: radial pulse 2+  GASTROINTESTINAL: Abdomen is soft, non-tender, not distended w/ normal bowel sounds. GENITOURINARY: Bladder non tender, not distended  MUSCULOSKELETAL: No abnormal joints or musculature  NEUROLOGIC: Oriented to self. Moves all extremities no tremor.  PSYCHIATRIC: Mood and affect appropriate to situation, no behavioral issues  Filed Vitals:   11/25/12 1203  BP: 128/79  Pulse: 72  Temp: 98.4 F (36.9 C)  Resp: 20  Weight: 131 lb (59.421 kg)      Labs reviewed: Liver Profile  Result: 07/08/2012 4:15 AM ( Status: F ) C  Bilirubin, Total 0.4 0.3-1.2 mg/dL SLN  Bilirubin, Direct 0.1 0.0-0.3 mg/dL SLN  Indirect Bilirubin 0.3 0.0-0.9 mg/dL SLN  Alkaline Phosphatase 72 39-117 U/L SLN  AST/SGOT 13 0-37 U/L SLN  ALT/SGPT <8 0-35 U/L SLN  Total Protein 4.9 L 6.0-8.3 g/dL SLN  Albumin 2.9 L 8.4-6.9 g/dL SLN  CBC NO Diff (Complete Blood Count)  Result: 06/26/2012 5:23 PM ( Status: F )  WBC 7.2 4.0-10.5 K/uL SLN  RBC 4.21 3.87-5.11 MIL/uL SLN  Hemoglobin 11.8 L 12.0-15.0 g/dL SLN  Hematocrit 62.9 52.8-41.3 % SLN  MCV 88.1 78.0-100.0 fL SLN  MCH 28.0 26.0-34.0 pg SLN  MCHC 31.8 30.0-36.0 g/dL SLN  RDW 24.4 01.0-27.2 % SLN  Platelet Count 215 150-400 K/uL SLN  Basic Metabolic Panel  Result: 06/26/2012 6:13 PM ( Status: F )  Sodium 143 135-145 mEq/L SLN  Potassium 4.1 3.5-5.3 mEq/L SLN  Chloride 109 96-112 mEq/L SLN  CO2 26 19-32 mEq/L SLN  Glucose 73 70-99 mg/dL SLN  BUN 21 5-36 mg/dL SLN  Creatinine 6.44 0.34-7.42 mg/dL SLN  Calcium 8.2 L 5.9-56.3 mg/dL SLN  TSH, Ultrasensitive  Result: 06/26/2012 4:09 PM ( Status: F )  TSH 0.917 0.350-4.500   CBC with Diff    Result: 10/29/2012 2:34 PM   ( Status: F )     C WBC 6.4     4.0-10.5 K/uL SLN   RBC 4.27     3.87-5.11 MIL/uL SLN   Hemoglobin 11.8   L 12.0-15.0 g/dL SLN   Hematocrit 87.5     36.0-46.0 % SLN   MCV 87.6     78.0-100.0 fL SLN   MCH 27.6     26.0-34.0 pg SLN   MCHC 31.6     30.0-36.0 g/dL SLN    RDW 64.3     32.9-51.8 % SLN   Platelet Count 230     150-400 K/uL SLN   Granulocyte % 55     43-77 % SLN   Absolute Gran 3.5     1.7-7.7 K/uL SLN   Lymph % 37     12-46 % SLN   Absolute Lymph 2.4     0.7-4.0 K/uL SLN   Mono %  7     3-12 % SLN   Absolute Mono 0.5     0.1-1.0 K/uL SLN   Eos % 1     0-5 % SLN   Absolute Eos 0.1     0.0-0.7 K/uL SLN   Baso % 0     0-1 % SLN   Absolute Baso 0.0     0.0-0.1 K/uL SLN   Smear Review Criteria for review not met  SLN   Comprehensive Metabolic Panel    Result: 10/29/2012 1:49 PM   ( Status: F )       Sodium 143     135-145 mEq/L SLN   Potassium 4.3     3.5-5.3 mEq/L SLN   Chloride 110     96-112 mEq/L SLN   CO2 26     19-32 mEq/L SLN   Glucose 78     70-99 mg/dL SLN   BUN 27   H 4-09 mg/dL SLN   Creatinine 8.11     0.50-1.10 mg/dL SLN   Bilirubin, Total 0.3     0.3-1.2 mg/dL SLN   Alkaline Phosphatase 71     39-117 U/L SLN   AST/SGOT 11     0-37 U/L SLN   ALT/SGPT <8     0-35 U/L SLN   Total Protein 5.3   L 6.0-8.3 g/dL SLN   Albumin 3.1   L 9.1-4.7 g/dL SLN   Calcium 8.3   L   Assessment/Plan 1. Hypertension Patient is stable; continue current regimen. Will monitor and make changes as necessary.  2. Dementia conts to function well in current setting; will cont current medications  3. Renal failure BUN and Cr remain stable  4. B12 deficiency Stable

## 2012-11-28 ENCOUNTER — Other Ambulatory Visit: Payer: Self-pay | Admitting: *Deleted

## 2012-11-28 MED ORDER — LORAZEPAM 0.5 MG PO TABS: ORAL_TABLET | ORAL | Status: DC

## 2012-11-28 NOTE — Telephone Encounter (Signed)
rx signed and faxed

## 2012-12-23 ENCOUNTER — Non-Acute Institutional Stay (SKILLED_NURSING_FACILITY): Payer: Medicare Other | Admitting: Nurse Practitioner

## 2012-12-23 DIAGNOSIS — I6350 Cerebral infarction due to unspecified occlusion or stenosis of unspecified cerebral artery: Secondary | ICD-10-CM

## 2012-12-23 DIAGNOSIS — E538 Deficiency of other specified B group vitamins: Secondary | ICD-10-CM

## 2012-12-23 DIAGNOSIS — I639 Cerebral infarction, unspecified: Secondary | ICD-10-CM

## 2012-12-23 DIAGNOSIS — F0390 Unspecified dementia without behavioral disturbance: Secondary | ICD-10-CM

## 2012-12-23 DIAGNOSIS — I10 Essential (primary) hypertension: Secondary | ICD-10-CM

## 2012-12-23 DIAGNOSIS — M1990 Unspecified osteoarthritis, unspecified site: Secondary | ICD-10-CM

## 2012-12-23 DIAGNOSIS — K5900 Constipation, unspecified: Secondary | ICD-10-CM

## 2012-12-23 NOTE — Progress Notes (Addendum)
Patient ID: Katie Gibson, female   DOB: 06-23-1910, 79 y.o.   MRN: [id number] Nursing Home Location:  [facility]   Place of Service: SNF (31)  PCP: [name], MD  Code Status: FULL  No Known Allergies  Chief Complaint  Patient presents with  . Medical Managment of Chronic Issues    HPI:  Pt is an 79 year old female who is a long term resident of [facility] seen today for a routine visit. Pt with hx of hyperlipidemia, anemia, anxiety, alzheimer's, htn, OA and constipation. Pt with dementia does not have any complaints and staff without any concerns at this time  REASSESSMENT OF ONGOING PROBLEMS:  HYPERLIPIDEMIA The patient's most recent LDL is at goal.No complications noted from the medication presently being used. on zocor 10mg  daily  OTHER VITAMIN B12 DEFICIENCY ANEMIA stable; takes vit b12 1000 mcg daily  ANXIETY stable; No complications noted from the medication presently being used. has ativan 0.5 mg every 8 hours as needed  ALZHEIMER'S The Alzheimer's disease has not changed.No complications noted from the medication presently being used. The patient has had little change in behavior pt is taking aricept 10 mg daily takes namenda daily  HTN UNSPECIFIED. No complications noted from the medication presently being used. takes norvasc 5 mg daily; takes asa 81 mg daily  CONSTIPATION The symptoms are stable.The medication is well tolerated.takes senna s 2 tabs daily  OSTEOARTHRITIS The patient has mild joint pain.No complications noted from the medication presently being used. takes ultram 50 mg twice daily scheduled and has as needed if needed    Review of Systems:  Review of Systems  Unable to perform ROS: dementia     Past Medical History  Diagnosis Date  . Dementia   . DVT (deep venous thrombosis)   . Hypertension   . Hyperlipidemia   . Depression   . GERD (gastroesophageal reflux disease)   . Coronary artery disease    Past Surgical History   Procedure Laterality Date  . Eye surgery    . Cataract extractions with lens implantation     Social History:   reports that she has never smoked. She does not have any smokeless tobacco history on file. She reports that she does not drink alcohol or use illicit drugs.  Family History  Problem Relation Age of Onset  . Stroke Daughter     Medications: Patient's Medications  New Prescriptions   No medications on file  Previous Medications   AMLODIPINE (NORVASC) 5 MG TABLET    Take 1 tablet (5 mg total) by mouth daily.   ASPIRIN EC 81 MG TABLET    Take 81 mg by mouth daily.   BISACODYL (DULCOLAX) 10 MG SUPPOSITORY    Place 10 mg rectally daily as needed. For constipation if mom does not work   CYANOCOBALAMIN (VITAMIN B 12 PO)    Take 1,000 mg by mouth daily.   DONEPEZIL (ARICEPT) 10 MG TABLET    Take 10 mg by mouth at bedtime.   LORAZEPAM (ATIVAN) 0.5 MG TABLET    Take one tablet every 6 hours as needed   MAGNESIUM HYDROXIDE (MILK OF MAGNESIA) 400 MG/5ML SUSPENSION    Take 30 mLs by mouth daily as needed. For constipation   MEMANTINE HCL ER (NAMENDA XR) 28 MG CP24    Take by mouth.   SENNA-DOCUSATE (SENOKOT-S) 8.6-50 MG PER TABLET    Take 2 tablets by mouth daily.   SIMVASTATIN (ZOCOR) 10 MG TABLET  Take 1 tablet (10 mg total) by mouth daily at 6 PM.   SODIUM PHOSPHATES (RA SALINE ENEMA) 19-7 GM/118ML ENEM    Place 1 enema rectally daily as needed. For constipation when mom, and bisacodyl supp does not work   TRAMADOL (ULTRAM) 50 MG TABLET    Take 2 tablets by mouth twice daily; 1 tablet by mouth daily as needed for pain.  Modified Medications   No medications on file  Discontinued Medications   No medications on file     Physical Exam:  Filed Vitals:   12/23/12 1139  BP: 145/71  Pulse: 66  Temp: 97.1 F (36.2 C)  Resp: 20  Weight: 131 lb (59.421 kg)    GENERAL APPEARANCE: Alert, conversant. Appropriately groomed. No acute distress.  SKIN: No diaphoresis rash, or  wounds HEENT: unremarkable  NECK: No thyroid tenderness, enlargement or nodule  RESPIRATORY: Breathing is even, unlabored. Lung sounds are clear   CARDIOVASCULAR: Heart RRR no murmurs, rubs or gallops. No peripheral edema.  ARTERIAL: radial pulse 2+ GASTROINTESTINAL: Abdomen is soft, non-tender, not distended w/ normal bowel sounds. GENITOURINARY: Bladder non tender, not distended  MUSCULOSKELETAL: No abnormal joints or musculature NEUROLOGIC: Oriented X1. Moves all extremities no tremor. PSYCHIATRIC: Mood and affect appropriate to situation, no behavioral issues   Labs reviewed:  Liver Profile  Result: 07/08/2012 4:15 AM ( Status: F ) C  Bilirubin, Total 0.4 0.3-1.2 mg/dL SLN  Bilirubin, Direct 0.1 0.0-0.3 mg/dL SLN  Indirect Bilirubin 0.3 0.0-0.9 mg/dL SLN  Alkaline Phosphatase 72 39-117 U/L SLN  AST/SGOT 13 0-37 U/L SLN  ALT/SGPT <8 0-35 U/L SLN  Total Protein 4.9 L 6.0-8.3 g/dL SLN  Albumin 2.9 L 0.4-5.4 g/dL SLN  CBC NO Diff (Complete Blood Count)  Result: 06/26/2012 5:23 PM ( Status: F )  WBC 7.2 4.0-10.5 K/uL SLN  RBC 4.21 3.87-5.11 MIL/uL SLN  Hemoglobin 11.8 L 12.0-15.0 g/dL SLN  Hematocrit 09.8 11.9-14.7 % SLN  MCV 88.1 78.0-100.0 fL SLN  MCH 28.0 26.0-34.0 pg SLN  MCHC 31.8 30.0-36.0 g/dL SLN  RDW 82.9 56.2-13.0 % SLN  Platelet Count 215 150-400 K/uL SLN  Basic Metabolic Panel  Result: 06/26/2012 6:13 PM ( Status: F )  Sodium 143 135-145 mEq/L SLN  Potassium 4.1 3.5-5.3 mEq/L SLN  Chloride 109 96-112 mEq/L SLN  CO2 26 19-32 mEq/L SLN  Glucose 73 70-99 mg/dL SLN  BUN 21 8-65 mg/dL SLN  Creatinine 7.84 6.96-2.95 mg/dL SLN  Calcium 8.2 L 2.8-41.3 mg/dL SLN  TSH, Ultrasensitive  Result: 06/26/2012 4:09 PM ( Status: F )  TSH 0.917 0.350-4.500  CBC with Diff  Result: 10/29/2012 2:34 PM ( Status: F ) C  WBC 6.4 4.0-10.5 K/uL SLN  RBC 4.27 3.87-5.11 MIL/uL SLN  Hemoglobin 11.8 L 12.0-15.0 g/dL SLN  Hematocrit 24.4 01.0-27.2 % SLN  MCV 87.6 78.0-100.0 fL SLN  MCH 27.6  26.0-34.0 pg SLN  MCHC 31.6 30.0-36.0 g/dL SLN  RDW 53.6 64.4-03.4 % SLN  Platelet Count 230 150-400 K/uL SLN  Granulocyte % 55 43-77 % SLN  Absolute Gran 3.5 1.7-7.7 K/uL SLN  Lymph % 37 12-46 % SLN  Absolute Lymph 2.4 0.7-4.0 K/uL SLN  Mono % 7 3-12 % SLN  Absolute Mono 0.5 0.1-1.0 K/uL SLN  Eos % 1 0-5 % SLN  Absolute Eos 0.1 0.0-0.7 K/uL SLN  Baso % 0 0-1 % SLN  Absolute Baso 0.0 0.0-0.1 K/uL SLN  Smear Review Criteria for review not met SLN  Comprehensive Metabolic Panel  Result: 10/29/2012 1:49  PM ( Status: F )  Sodium 143 135-145 mEq/L SLN  Potassium 4.3 3.5-5.3 mEq/L SLN  Chloride 110 96-112 mEq/L SLN  CO2 26 19-32 mEq/L SLN  Glucose 78 70-99 mg/dL SLN  BUN 27 H 1-61 mg/dL SLN  Creatinine 0.96 0.45-4.09 mg/dL SLN  Bilirubin, Total 0.3 0.3-1.2 mg/dL SLN  Alkaline Phosphatase 71 39-117 U/L SLN  AST/SGOT 11 0-37 U/L SLN  ALT/SGPT <8 0-35 U/L SLN  Total Protein 5.3 L 6.0-8.3 g/dL SLN  Albumin 3.1 L 8.1-1.9 g/dL SLN  Calcium 8.3 L      Assessment/Plan 1. Hypertension -pt remains stable on current medications  2. B12 deficiency -on supplements CBC stable  3. Constipation -remains stable on current medications   4. Dementia -functions well in current setting; will cont current medications   5. CVA (cerebral infarction) -stable; conts on ASA  6. OA -still complains of pain; has scheduled tramadol which helps; will cont current medications

## 2013-01-20 ENCOUNTER — Encounter: Payer: Self-pay | Admitting: Nurse Practitioner

## 2013-01-20 ENCOUNTER — Non-Acute Institutional Stay (SKILLED_NURSING_FACILITY): Payer: Medicare Other | Admitting: Nurse Practitioner

## 2013-01-20 DIAGNOSIS — I10 Essential (primary) hypertension: Secondary | ICD-10-CM

## 2013-01-20 DIAGNOSIS — M1990 Unspecified osteoarthritis, unspecified site: Secondary | ICD-10-CM

## 2013-01-20 DIAGNOSIS — E785 Hyperlipidemia, unspecified: Secondary | ICD-10-CM

## 2013-01-20 DIAGNOSIS — F0390 Unspecified dementia without behavioral disturbance: Secondary | ICD-10-CM

## 2013-01-20 DIAGNOSIS — K5900 Constipation, unspecified: Secondary | ICD-10-CM

## 2013-01-20 NOTE — Progress Notes (Signed)
Patient ID: Katie Gibson, female   DOB: Nov 15, 1910, 79 y.o.   MRN: [id number]    Nursing Home Location:  [facility]   Place of Service: SNF (31)  PCP: [name], MD  No Known Allergies  Chief Complaint  Patient presents with  . Medical Managment of Chronic Issues    HPI:  Pt is an 79 year old female who is a long term resident of [facility] seen today for a routine visit. Pt with hx of hyperlipidemia, anemia, anxiety, alzheimer's, htn, OA and constipation. Pt with dementia does not have any complaints and staff without any concerns at this time  Pharm recommendations for evaluation of Zocor due to pts age  REASSESSMENT OF ONGOING PROBLEMS:  HYPERLIPIDEMIA The patient's most recent LDL is at goal.No complications noted from the medication presently being used. on zocor 10mg  daily  OTHER VITAMIN B12 DEFICIENCY ANEMIA stable; takes vit b12 1000 mcg daily  ANXIETY stable; No complications noted from the medication presently being used. has ativan 0.5 mg every 8 hours as needed  ALZHEIMER'S The Alzheimer's disease has not changed.No complications noted from the medication presently being used. The patient has had little change in behavior in the last months; pt is taking aricept 10 mg daily takes namenda daily  HTN UNSPECIFIED. No complications noted from the medication presently being used. takes norvasc 5 mg daily; takes asa 81 mg daily  CONSTIPATION The symptoms are stable.The medication is well tolerated.takes senna s 2 tabs daily  OSTEOARTHRITIS The patient has mild joint pain.No complications noted from the medication presently being used. takes ultram 50 mg twice daily scheduled and has as needed if needed      Review of Systems:  Unable to provide ROS due to dementia  Past Medical History  Diagnosis Date  . Dementia   . DVT (deep venous thrombosis)   . Hypertension   . Hyperlipidemia   . Depression   . GERD (gastroesophageal reflux disease)   .  Coronary artery disease    Past Surgical History  Procedure Laterality Date  . Eye surgery    . Cataract extractions with lens implantation     Social History:   reports that she has never smoked. She does not have any smokeless tobacco history on file. She reports that she does not drink alcohol or use illicit drugs.  Family History  Problem Relation Age of Onset  . Stroke Daughter     Medications: Patient's Medications  New Prescriptions   No medications on file  Previous Medications   AMLODIPINE (NORVASC) 5 MG TABLET    Take 1 tablet (5 mg total) by mouth daily.   ASPIRIN EC 81 MG TABLET    Take 81 mg by mouth daily.   BISACODYL (DULCOLAX) 10 MG SUPPOSITORY    Place 10 mg rectally daily as needed. For constipation if mom does not work   CYANOCOBALAMIN (VITAMIN B 12 PO)    Take 1,000 mg by mouth daily.   DONEPEZIL (ARICEPT) 10 MG TABLET    Take 10 mg by mouth at bedtime.   LORAZEPAM (ATIVAN) 0.5 MG TABLET    Take one tablet every 6 hours as needed   MAGNESIUM HYDROXIDE (MILK OF MAGNESIA) 400 MG/5ML SUSPENSION    Take 30 mLs by mouth daily as needed. For constipation   MEMANTINE HCL ER (NAMENDA XR) 28 MG CP24    Take by mouth.   SENNA-DOCUSATE (SENOKOT-S) 8.6-50 MG PER TABLET    Take 2  tablets by mouth daily.   SIMVASTATIN (ZOCOR) 10 MG TABLET    Take 1 tablet (10 mg total) by mouth daily at 6 PM.   SODIUM PHOSPHATES (RA SALINE ENEMA) 19-7 GM/118ML ENEM    Place 1 enema rectally daily as needed. For constipation when mom, and bisacodyl supp does not work   TRAMADOL (ULTRAM) 50 MG TABLET    Take 2 tablets by mouth twice daily; 1 tablet by mouth daily as needed for pain.  Modified Medications   No medications on file  Discontinued Medications   No medications on file     Physical Exam:  Filed Vitals:   01/20/13 1253  BP: 111/65  Pulse: 69  Temp: 98.7 F (37.1 C)  Resp: 20   Physical Exam  Vitals reviewed. Constitutional: She is well-developed, well-nourished, and in  no distress. No distress.  HENT:  Head: Normocephalic and atraumatic.  Mouth/Throat: Oropharynx is clear and moist.  Eyes: Conjunctivae and EOM are normal. Pupils are equal, round, and reactive to light.  Neck: Normal range of motion. Neck supple.  Cardiovascular: Normal rate, regular rhythm and normal heart sounds.   Pulmonary/Chest: Effort normal and breath sounds normal. No respiratory distress.  Abdominal: Soft. Bowel sounds are normal.  Musculoskeletal: Normal range of motion. She exhibits no edema and no tenderness.  Neurological: She is alert.  Skin: Skin is warm and dry. She is not diaphoretic.  Psychiatric: Affect normal.      Labs reviewed: Liver Profile  Result: 07/08/2012 4:15 AM ( Status: F ) C  Bilirubin, Total 0.4 0.3-1.2 mg/dL SLN  Bilirubin, Direct 0.1 0.0-0.3 mg/dL SLN  Indirect Bilirubin 0.3 0.0-0.9 mg/dL SLN  Alkaline Phosphatase 72 39-117 U/L SLN  AST/SGOT 13 0-37 U/L SLN  ALT/SGPT <8 0-35 U/L SLN  Total Protein 4.9 L 6.0-8.3 g/dL SLN  Albumin 2.9 L 0.9-8.1 g/dL SLN  CBC NO Diff (Complete Blood Count)  Result: 06/26/2012 5:23 PM ( Status: F )  WBC 7.2 4.0-10.5 K/uL SLN  RBC 4.21 3.87-5.11 MIL/uL SLN  Hemoglobin 11.8 L 12.0-15.0 g/dL SLN  Hematocrit 19.1 47.8-29.5 % SLN  MCV 88.1 78.0-100.0 fL SLN  MCH 28.0 26.0-34.0 pg SLN  MCHC 31.8 30.0-36.0 g/dL SLN  RDW 62.1 30.8-65.7 % SLN  Platelet Count 215 150-400 K/uL SLN  Basic Metabolic Panel  Result: 06/26/2012 6:13 PM ( Status: F )  Sodium 143 135-145 mEq/L SLN  Potassium 4.1 3.5-5.3 mEq/L SLN  Chloride 109 96-112 mEq/L SLN  CO2 26 19-32 mEq/L SLN  Glucose 73 70-99 mg/dL SLN  BUN 21 8-46 mg/dL SLN  Creatinine 9.62 9.52-8.41 mg/dL SLN  Calcium 8.2 L 3.2-44.0 mg/dL SLN  TSH, Ultrasensitive  Result: 06/26/2012 4:09 PM ( Status: F )  TSH 0.917 0.350-4.500  CBC with Diff  Result: 10/29/2012 2:34 PM ( Status: F ) C  WBC 6.4 4.0-10.5 K/uL SLN  RBC 4.27 3.87-5.11 MIL/uL SLN  Hemoglobin 11.8 L 12.0-15.0 g/dL  SLN  Hematocrit 10.2 72.5-36.6 % SLN  MCV 87.6 78.0-100.0 fL SLN  MCH 27.6 26.0-34.0 pg SLN  MCHC 31.6 30.0-36.0 g/dL SLN  RDW 44.0 34.7-42.5 % SLN  Platelet Count 230 150-400 K/uL SLN  Granulocyte % 55 43-77 % SLN  Absolute Gran 3.5 1.7-7.7 K/uL SLN  Lymph % 37 12-46 % SLN  Absolute Lymph 2.4 0.7-4.0 K/uL SLN  Mono % 7 3-12 % SLN  Absolute Mono 0.5 0.1-1.0 K/uL SLN  Eos % 1 0-5 % SLN  Absolute Eos 0.1 0.0-0.7 K/uL SLN  Baso %  0 0-1 % SLN  Absolute Baso 0.0 0.0-0.1 K/uL SLN  Smear Review Criteria for review not met SLN  Comprehensive Metabolic Panel  Result: 10/29/2012 1:49 PM ( Status: F )  Sodium 143 135-145 mEq/L SLN  Potassium 4.3 3.5-5.3 mEq/L SLN  Chloride 110 96-112 mEq/L SLN  CO2 26 19-32 mEq/L SLN  Glucose 78 70-99 mg/dL SLN  BUN 27 H 1-47 mg/dL SLN  Creatinine 8.29 5.62-1.30 mg/dL SLN  Bilirubin, Total 0.3 0.3-1.2 mg/dL SLN  Alkaline Phosphatase 71 39-117 U/L SLN  AST/SGOT 11 0-37 U/L SLN  ALT/SGPT <8 0-35 U/L SLN  Total Protein 5.3 L 6.0-8.3 g/dL SLN  Albumin 3.1 L 8.6-5.7 g/dL SLN  Calcium 8.3 L    Assessment/Plan 1. Osteoarthritis -mild pain, cont PRNs  2. Dementia without behavioral disturbance Little changes in the last few months, cont aricept and namenda at this time  3. Constipation Will cont current medications  4. Hypertension Patient is stable; continue current regimen. Will monitor and make changes as necessary.  5. Hyperlipemia Will dc Zocor at this time due to pts age of 42 and the need for longevity of use

## 2013-02-09 ENCOUNTER — Emergency Department (HOSPITAL_COMMUNITY): Payer: Medicare Other

## 2013-02-09 ENCOUNTER — Inpatient Hospital Stay (HOSPITAL_COMMUNITY)
Admission: EM | Admit: 2013-02-09 | Discharge: 2013-02-11 | DRG: 072 | Disposition: A | Discharge status: Skilled Nursing Facility | Payer: Medicare Other | Attending: Internal Medicine | Admitting: Internal Medicine

## 2013-02-09 ENCOUNTER — Encounter (HOSPITAL_COMMUNITY): Payer: Self-pay | Admitting: Emergency Medicine

## 2013-02-09 DIAGNOSIS — T424X5A Adverse effect of benzodiazepines, initial encounter: Secondary | ICD-10-CM | POA: Diagnosis present

## 2013-02-09 DIAGNOSIS — K219 Gastro-esophageal reflux disease without esophagitis: Secondary | ICD-10-CM | POA: Diagnosis present

## 2013-02-09 DIAGNOSIS — F0390 Unspecified dementia without behavioral disturbance: Secondary | ICD-10-CM | POA: Diagnosis present

## 2013-02-09 DIAGNOSIS — I639 Cerebral infarction, unspecified: Secondary | ICD-10-CM | POA: Diagnosis present

## 2013-02-09 DIAGNOSIS — I2510 Atherosclerotic heart disease of native coronary artery without angina pectoris: Secondary | ICD-10-CM | POA: Diagnosis present

## 2013-02-09 DIAGNOSIS — E538 Deficiency of other specified B group vitamins: Secondary | ICD-10-CM | POA: Diagnosis present

## 2013-02-09 DIAGNOSIS — M1990 Unspecified osteoarthritis, unspecified site: Secondary | ICD-10-CM

## 2013-02-09 DIAGNOSIS — R4182 Altered mental status, unspecified: Secondary | ICD-10-CM

## 2013-02-09 DIAGNOSIS — Z86718 Personal history of other venous thrombosis and embolism: Secondary | ICD-10-CM

## 2013-02-09 DIAGNOSIS — E785 Hyperlipidemia, unspecified: Secondary | ICD-10-CM | POA: Diagnosis present

## 2013-02-09 DIAGNOSIS — Z8673 Personal history of transient ischemic attack (TIA), and cerebral infarction without residual deficits: Secondary | ICD-10-CM

## 2013-02-09 DIAGNOSIS — R4189 Other symptoms and signs involving cognitive functions and awareness: Secondary | ICD-10-CM | POA: Diagnosis present

## 2013-02-09 DIAGNOSIS — Z79899 Other long term (current) drug therapy: Secondary | ICD-10-CM

## 2013-02-09 DIAGNOSIS — G9340 Encephalopathy, unspecified: Principal | ICD-10-CM | POA: Diagnosis present

## 2013-02-09 DIAGNOSIS — Z7982 Long term (current) use of aspirin: Secondary | ICD-10-CM

## 2013-02-09 DIAGNOSIS — I10 Essential (primary) hypertension: Secondary | ICD-10-CM

## 2013-02-09 LAB — CBC WITH DIFFERENTIAL/PLATELET
Hemoglobin: 13.1 g/dL (ref 12.0–15.0)
Lymphocytes Relative: 29 % (ref 12–46)
Lymphs Abs: 1.9 10*3/uL (ref 0.7–4.0)
Monocytes Relative: 7 % (ref 3–12)
Neutro Abs: 4.2 10*3/uL (ref 1.7–7.7)
Neutrophils Relative %: 63 % (ref 43–77)
Platelets: 211 10*3/uL (ref 150–400)
RBC: 4.48 MIL/uL (ref 3.87–5.11)
WBC: 6.6 10*3/uL (ref 4.0–10.5)

## 2013-02-09 LAB — URINALYSIS, ROUTINE W REFLEX MICROSCOPIC
Glucose, UA: NEGATIVE mg/dL
Hgb urine dipstick: NEGATIVE
Ketones, ur: NEGATIVE mg/dL
Protein, ur: NEGATIVE mg/dL
Urobilinogen, UA: 1 mg/dL (ref 0.0–1.0)

## 2013-02-09 LAB — RAPID URINE DRUG SCREEN, HOSP PERFORMED
Amphetamines: NOT DETECTED
Benzodiazepines: NOT DETECTED
Opiates: NOT DETECTED
Tetrahydrocannabinol: NOT DETECTED

## 2013-02-09 LAB — POCT I-STAT 3, ART BLOOD GAS (G3+)
pCO2 arterial: 44.9 mmHg (ref 35.0–45.0)
pO2, Arterial: 77 mmHg — ABNORMAL LOW (ref 80.0–100.0)

## 2013-02-09 LAB — COMPREHENSIVE METABOLIC PANEL
ALT: 10 U/L (ref 0–35)
Albumin: 3 g/dL — ABNORMAL LOW (ref 3.5–5.2)
Alkaline Phosphatase: 88 U/L (ref 39–117)
BUN: 26 mg/dL — ABNORMAL HIGH (ref 6–23)
Chloride: 104 mEq/L (ref 96–112)
GFR calc Af Amer: 51 mL/min — ABNORMAL LOW (ref >90)
Glucose, Bld: 91 mg/dL (ref 70–99)
Potassium: 5.1 mEq/L (ref 3.5–5.1)
Sodium: 137 mEq/L (ref 135–145)
Total Bilirubin: 0.2 mg/dL — ABNORMAL LOW (ref 0.3–1.2)
Total Protein: 6.4 g/dL (ref 6.0–8.3)

## 2013-02-09 LAB — TROPONIN I: Troponin I: <0.3 ng/mL (ref <0.30)

## 2013-02-09 LAB — URINE MICROSCOPIC-ADD ON

## 2013-02-09 LAB — ETHANOL: Alcohol, Ethyl (B): <11 mg/dL (ref 0–11)

## 2013-02-09 MED ORDER — NALOXONE HCL 0.4 MG/ML IJ SOLN: 0.4000 mg | Freq: Once | INTRAMUSCULAR | Status: DC

## 2013-02-09 MED ORDER — NALOXONE HCL 0.4 MG/ML IJ SOLN
0.4000 mg | Freq: Once | INTRAMUSCULAR | Status: AC
  Administered 2013-02-09: 0.4 mg via INTRAVENOUS
  Filled 2013-02-09: qty 1

## 2013-02-09 NOTE — ED Notes (Signed)
Spoke w pts daughter on phone and gave update. States that pt is normally alert enough to play games. Does state that pt has had decreased appetite lately.

## 2013-02-09 NOTE — ED Provider Notes (Signed)
CSN: [id number]     Arrival date & time 02/09/13  1922 History   First MD Initiated Contact with Patient 02/09/13 1932     Chief Complaint  Patient presents with  . Drug Overdose  . Fatigue   (Consider location/radiation/quality/duration/timing/severity/associated sxs/prior Treatment) HPI Comments: Patient presents from nursing home with decreased mental status after receiving tramadol and Ativan. These are when necessary medications for her. She was unresponsive for staff but responsive to stimulation on EMS arrival. She is unable to give a history. She will not speak. She will follow some commands.  The history is provided by the patient and the EMS personnel. The history is limited by the condition of the patient.    Past Medical History  Diagnosis Date  . Dementia   . DVT (deep venous thrombosis)   . Hypertension   . Hyperlipidemia   . Depression   . GERD (gastroesophageal reflux disease)   . Coronary artery disease    Past Surgical History  Procedure Laterality Date  . Eye surgery    . Cataract extractions with lens implantation     Family History  Problem Relation Age of Onset  . Stroke Daughter    History  Substance Use Topics  . Smoking status: Never Smoker   . Smokeless tobacco: Not on file  . Alcohol Use: No   OB History   Grav Para Term Preterm Abortions TAB SAB Ect Mult Living                 Review of Systems  Unable to perform ROS: Mental status change    Allergies  Review of patient's allergies indicates no known allergies.  Home Medications   No current outpatient prescriptions on file. BP 103/88  Pulse 74  Temp(Src) 98.2 F (36.8 C) (Axillary)  Resp 12  Ht 5\' 5"  (1.651 m)  Wt 126 lb 15.8 oz (57.6 kg)  BMI 21.13 kg/m2  SpO2 98% Physical Exam  Constitutional: She is oriented to person, place, and time. She appears well-developed and well-nourished. No distress.  Somnolent, arousable to pain, follow some commands  HENT:  Head:  Normocephalic and atraumatic.  Mouth/Throat: Oropharynx is clear and moist. No oropharyngeal exudate.  Eyes: Conjunctivae and EOM are normal.  Pinpoint pupils bilaterally  Neck: Normal range of motion. Neck supple.  Cardiovascular: Normal rate, regular rhythm and normal heart sounds.   No murmur heard. Pulmonary/Chest: Effort normal and breath sounds normal. No respiratory distress.  Abdominal: Soft. There is no tenderness. There is no rebound and no guarding.  Musculoskeletal: Normal range of motion. She exhibits no edema and no tenderness.  Neurological: She is oriented to person, place, and time. No cranial nerve deficit. She exhibits normal muscle tone. Coordination normal.  Able to wiggle toes  Skin: Skin is warm.    ED Course  Procedures (including critical care time) Labs Review Labs Reviewed  CBC WITH DIFFERENTIAL - Abnormal; Notable for the following:    RDW 16.3 (*)    All other components within normal limits  COMPREHENSIVE METABOLIC PANEL - Abnormal; Notable for the following:    BUN 26 (*)    Albumin 3.0 (*)    Total Bilirubin 0.2 (*)    GFR calc non Af Amer 44 (*)    GFR calc Af Amer 51 (*)    All other components within normal limits  SALICYLATE LEVEL - Abnormal; Notable for the following:    Salicylate Lvl <2.0 (*)    All other components within  normal limits  URINALYSIS, ROUTINE W REFLEX MICROSCOPIC - Abnormal; Notable for the following:    APPearance CLOUDY (*)    Leukocytes, UA TRACE (*)    All other components within normal limits  POCT I-STAT 3, BLOOD GAS (G3+) - Abnormal; Notable for the following:    pO2, Arterial 77.0 (*)    Bicarbonate 27.6 (*)    All other components within normal limits  TROPONIN I  ACETAMINOPHEN LEVEL  ETHANOL  URINE RAPID DRUG SCREEN (HOSP PERFORMED)  URINE MICROSCOPIC-ADD ON  CBC  CREATININE, SERUM  TSH  VITAMIN B12   Imaging Review Ct Head Wo Contrast  02/09/2013   CLINICAL DATA:  Altered mental status.  Drug  overdose.  Fatigue.  EXAM: CT HEAD WITHOUT CONTRAST  TECHNIQUE: Contiguous axial images were obtained from the base of the skull through the vertex without intravenous contrast.  COMPARISON:  MRI 10/03/2011.  FINDINGS: No mass lesion, mass effect, midline shift, hydrocephalus, hemorrhage. No acute territorial cortical ischemia/infarct. Atrophy and chronic ischemic white matter disease is present. Benign basal ganglia calcifications are present. Calvarium intact. Chronic right sphenoid sinus this with mucoperiosteal thickening. Intracranial atherosclerosis.  IMPRESSION: Atrophy and chronic ischemic white matter disease without acute intracranial abnormality. Chronic right sphenoid sinusitis.   Electronically Signed   By: Andreas Newport M.D.   On: 02/09/2013 20:40   Dg Chest Portable 1 View  02/09/2013   CLINICAL DATA:  Drug overdose.  EXAM: PORTABLE CHEST - 1 VIEW  COMPARISON:  10/02/2011  FINDINGS: Cardiomegaly. Tortuosity and ectasia of the thoracic aorta. Prominence soft tissue in the right lower mediastinum may reflect aneurysmal dilatation of the aorta. This is stable since prior study. No confluent airspace opacities or effusions. No acute bony abnormality.  IMPRESSION: Cardiomegaly, vascular congestion.  Question aneurysmal dilatation of the ascending thoracic aorta, stable.   Electronically Signed   By: Charlett Nose M.D.   On: 02/09/2013 20:14    EKG Interpretation    Date/Time:  Monday February 09 2013 19:45:09 EST Ventricular Rate:  73 PR Interval:  163 QRS Duration: 140 QT Interval:  463 QTC Calculation: 510 R Axis:   16 Text Interpretation:  Normal sinus rhythm Right bundle branch block No significant change was found Confirmed by Manus Gunning  MD, Zalma Channing ([id number]) on 02/09/2013 8:15:02 PM            MDM   1. Altered mental status   2. Acute encephalopathy   3. B12 deficiency   4. Dementia without behavioral disturbance   5. Hypertension    Decreased mental status after  receiving tramadol and Ativan. Unclear baseline. Obtunded with pinpoint pupils.  No narcotics on med list. We'll give empiric narcan. No change with narcan.  CT head nonacute.  No CO2 retention.  Nursing contacted family by phone who stated patient normally is confused but able to speak and play cards.  This is a change today.  Uncertain etiology of AMS.  UA and CXR negative. CT head negative. Consider worsening dementia versus med effect.  Will admit for further workup.   Glynn Octave, MD 02/10/13 [phone]

## 2013-02-09 NOTE — H&P (Signed)
[facility] History and Physical  Katie Gibson YNW:[id number] DOB: 1910/06/22    PCP:   [name], MD   Chief Complaint: unresponsiveness.  HPI: Katie Gibson is an 79 y.o. female with hx of Dementia, depression, HTN, hx of DVT, CAD, a nursing home resident, with functional baseline status able to play cards, brought to the ER with unresponsiveness.  In the ER she has pinpoint pupil and was not responding except to noxious stimuli.  I was attempting several times to contact her family but unable.  Review of the record showed that she had an exact event before, and it was felt to be multifactorial including medication, dementia, and ? Infection.  Work up in the ER today included a head CT which showed no acute process, a CXR with some vascular congestion, and normal WBC, electrolytes, renal and liver fx tests, along with negative UA except trace leukocytes.  Her tylenol, ASA levels and drug screen was negative.  Hospitalist was asked to admit her for unresponsiveness.     Rewiew of Systems: Unable to obtain.   Past Medical History  Diagnosis Date  . Dementia   . DVT (deep venous thrombosis)   . Hypertension   . Hyperlipidemia   . Depression   . GERD (gastroesophageal reflux disease)   . Coronary artery disease     Past Surgical History  Procedure Laterality Date  . Eye surgery    . Cataract extractions with lens implantation      Medications:  HOME MEDS: Prior to Admission medications   Medication Sig Start Date End Date Taking? Authorizing Provider  amLODipine (NORVASC) 5 MG tablet Take 1 tablet (5 mg total) by mouth daily. 10/05/11 02/09/13 Yes Laveda Norman, MD  aspirin EC 81 MG tablet Take 81 mg by mouth daily.   Yes Historical Provider, MD  bisacodyl (DULCOLAX) 10 MG suppository Place 10 mg rectally daily as needed. For constipation if mom does not work   Yes Ecologist, MD  Cyanocobalamin (VITAMIN B 12 PO) Take 1,000 mg by mouth daily.   Yes  Historical Provider, MD  donepezil (ARICEPT) 10 MG tablet Take 10 mg by mouth at bedtime.   Yes Historical Provider, MD  LORazepam (ATIVAN) 0.5 MG tablet Take one tablet every 6 hours as needed 11/28/12  Yes Man X Mast, NP  magnesium hydroxide (MILK OF MAGNESIA) 400 MG/5ML suspension Take 30 mLs by mouth daily as needed. For constipation   Yes Historical Provider, MD  memantine (NAMENDA) 10 MG tablet Take 10 mg by mouth 2 (two) times daily.   Yes Historical Provider, MD  senna-docusate (SENOKOT-S) 8.6-50 MG per tablet Take 2 tablets by mouth daily.   Yes Historical Provider, MD  Sodium Phosphates (RA SALINE ENEMA) 19-7 GM/118ML ENEM Place 1 enema rectally daily as needed. For constipation when mom, and bisacodyl supp does not work   Yes Ecologist, MD  traMADol (ULTRAM) 50 MG tablet Take 2 tablets by mouth twice daily; 1 tablet by mouth daily as needed for pain. 10/06/12  Yes Tiffany L Reed, DO  simvastatin (ZOCOR) 10 MG tablet Take 1 tablet (10 mg total) by mouth daily at 6 PM. 10/05/11 10/04/12  Laveda Norman, MD     Allergies:  No Known Allergies  Social History:   reports that she has never smoked. She does not have any smokeless tobacco history on file. She reports that she does not drink alcohol or use illicit drugs.  Family History: Family History  Problem Relation Age of Onset  . Stroke Daughter      Physical Exam: Filed Vitals:   02/09/13 1932  BP: 163/56  Pulse: 64  Temp: 98.6 F (37 C)  TempSrc: Oral  Resp: 20  SpO2: 98%   Blood pressure 163/56, pulse 64, temperature 98.6 F (37 C), temperature source Oral, resp. rate 20, SpO2 98.00%.  GEN: unresponsive except to painful stimuli. PSYCH:  Unresponsive. HEENT: carotid bruit; no JVD; There were no stridor. Neck is very supple. Pupil pinpoint and slightly reactive. Breasts:: Not examined CHEST WALL: No tenderness CHEST: Normal respiration, clear to auscultation bilaterally.  HEART: Regular rate and rhythm.   There are no murmur, rub, or gallops.   BACK: No kyphosis or scoliosis; no CVA tenderness ABDOMEN: soft and non-tender; no masses, no organomegaly, normal abdominal bowel sounds; no pannus; no intertriginous candida. There is no rebound and no distention. Rectal Exam: Not done EXTREMITIES:There is no calf tenderness. Genitalia: not examined PULSES: 2+ and symmetric SKIN: Normal hydration no rash or ulceration CNS: facial symmetry with grimaces.  Lower extremities moves slightly with noxious stimuli. Labs on Admission:  Basic Metabolic Panel:  Recent Labs Lab 02/09/13 1956  NA 137  K 5.1  CL 104  CO2 25  GLUCOSE 91  BUN 26*  CREATININE 1.01  CALCIUM 8.5   Liver Function Tests:  Recent Labs Lab 02/09/13 1956  AST 20  ALT 10  ALKPHOS 88  BILITOT 0.2*  PROT 6.4  ALBUMIN 3.0*   No results found for this basename: LIPASE, AMYLASE,  in the last 168 hours No results found for this basename: AMMONIA,  in the last 168 hours CBC:  Recent Labs Lab 02/09/13 1956  WBC 6.6  NEUTROABS 4.2  HGB 13.1  HCT 39.9  MCV 89.1  PLT 211   Cardiac Enzymes:  Recent Labs Lab 02/09/13 1956  TROPONINI <0.30    CBG: No results found for this basename: GLUCAP,  in the last 168 hours   Radiological Exams on Admission: Ct Head Wo Contrast  02/09/2013   CLINICAL DATA:  Altered mental status.  Drug overdose.  Fatigue.  EXAM: CT HEAD WITHOUT CONTRAST  TECHNIQUE: Contiguous axial images were obtained from the base of the skull through the vertex without intravenous contrast.  COMPARISON:  MRI 10/03/2011.  FINDINGS: No mass lesion, mass effect, midline shift, hydrocephalus, hemorrhage. No acute territorial cortical ischemia/infarct. Atrophy and chronic ischemic white matter disease is present. Benign basal ganglia calcifications are present. Calvarium intact. Chronic right sphenoid sinus this with mucoperiosteal thickening. Intracranial atherosclerosis.  IMPRESSION: Atrophy and chronic  ischemic white matter disease without acute intracranial abnormality. Chronic right sphenoid sinusitis.   Electronically Signed   By: Andreas Newport M.D.   On: 02/09/2013 20:40   Dg Chest Portable 1 View  02/09/2013   CLINICAL DATA:  Drug overdose.  EXAM: PORTABLE CHEST - 1 VIEW  COMPARISON:  10/02/2011  FINDINGS: Cardiomegaly. Tortuosity and ectasia of the thoracic aorta. Prominence soft tissue in the right lower mediastinum may reflect aneurysmal dilatation of the aorta. This is stable since prior study. No confluent airspace opacities or effusions. No acute bony abnormality.  IMPRESSION: Cardiomegaly, vascular congestion.  Question aneurysmal dilatation of the ascending thoracic aorta, stable.   Electronically Signed   By: Charlett Nose M.D.   On: 02/09/2013 20:14   Assessment/Plan Present on Admission:  . Acute encephalopathy . CVA (cerebral infarction) . Hypertension . Dementia without behavioral disturbance . Unresponsiveness  PLAN:  Sudden unresponsiveness of  unclear origin.  She did this once before and was admitted for same, which she became baseline.  I wonder if she has a seizure. Given her advanced age with dementia, I am not convinced aggressive work up is indicated.  Will give her IVF and see if she would improve first.  I will obtain an EEG.  I attempted to contact the family for her code status, but I was not able.  Please clarify her code status, and she is currently presumed a full code.  She is otherwise able to maintain her hemodynamics and will be admitted to [facility] service.  Thank you for asking me to participate in her care.  Other plans as per orders.  Code Status: presumed FULL CODE.  Unable to contact family.   Houston Siren, MD. [facility] Pager [phone] 7pm to 7am.  02/09/2013, 11:21 PM

## 2013-02-09 NOTE — ED Notes (Signed)
Pt arrives via EMS from [facility], pt was experiencing gen pain and was given ativan and tramadol. Pt then got sleepy and was unarousable and EMS was called. EMS reports pt snoring on scene, pupils 1. 160/80 HR 70 RR 28 96% RA. CBG 91. On admission to ED pt responsive to stimulation. Minimal verbal response. Opens eyes.

## 2013-02-10 ENCOUNTER — Inpatient Hospital Stay (HOSPITAL_COMMUNITY): Payer: Medicare Other

## 2013-02-10 LAB — CBC
Hemoglobin: 12.9 g/dL (ref 12.0–15.0)
MCHC: 32.3 g/dL (ref 30.0–36.0)
Platelets: 208 10*3/uL (ref 150–400)
RBC: 4.47 MIL/uL (ref 3.87–5.11)
WBC: 7.7 10*3/uL (ref 4.0–10.5)

## 2013-02-10 LAB — AMMONIA: Ammonia: 10 umol/L — ABNORMAL LOW (ref 11–60)

## 2013-02-10 LAB — GLUCOSE, CAPILLARY
Glucose-Capillary: 72 mg/dL (ref 70–99)
Glucose-Capillary: 85 mg/dL (ref 70–99)

## 2013-02-10 LAB — MRSA PCR SCREENING: MRSA by PCR: POSITIVE — AB

## 2013-02-10 LAB — CREATININE, SERUM
Creatinine, Ser: 1.05 mg/dL (ref 0.50–1.10)
GFR calc non Af Amer: 42 mL/min — ABNORMAL LOW (ref >90)

## 2013-02-10 LAB — TSH: TSH: 0.478 u[IU]/mL (ref 0.350–4.500)

## 2013-02-10 LAB — VITAMIN B12: Vitamin B-12: >2000 pg/mL — ABNORMAL HIGH (ref 211–911)

## 2013-02-10 MED ORDER — ASPIRIN EC 81 MG PO TBEC
81.0000 mg | DELAYED_RELEASE_TABLET | Freq: Every day | ORAL | Status: DC
  Administered 2013-02-11: 81 mg via ORAL
  Filled 2013-02-10 (×2): qty 1

## 2013-02-10 MED ORDER — SODIUM CHLORIDE 0.9 % IJ SOLN
3.0000 mL | Freq: Two times a day (BID) | INTRAMUSCULAR | Status: DC
  Administered 2013-02-10 – 2013-02-11 (×3): 3 mL via INTRAVENOUS

## 2013-02-10 MED ORDER — MEMANTINE HCL 10 MG PO TABS
10.0000 mg | ORAL_TABLET | Freq: Two times a day (BID) | ORAL | Status: DC
  Administered 2013-02-11 (×2): 10 mg via ORAL
  Filled 2013-02-10 (×5): qty 1

## 2013-02-10 MED ORDER — INSULIN ASPART 100 UNIT/ML ~~LOC~~ SOLN: 0.0000 [IU] | SUBCUTANEOUS | Status: DC

## 2013-02-10 MED ORDER — DONEPEZIL HCL 10 MG PO TABS
10.0000 mg | ORAL_TABLET | Freq: Every day | ORAL | Status: DC
  Administered 2013-02-11: 10 mg via ORAL
  Filled 2013-02-10 (×3): qty 1

## 2013-02-10 MED ORDER — THIAMINE HCL 100 MG/ML IJ SOLN
100.0000 mg | Freq: Every day | INTRAMUSCULAR | Status: DC
  Administered 2013-02-10: 100 mg via INTRAVENOUS
  Filled 2013-02-10 (×2): qty 1

## 2013-02-10 MED ORDER — HEPARIN SODIUM (PORCINE) 5000 UNIT/ML IJ SOLN
5000.0000 [IU] | Freq: Three times a day (TID) | INTRAMUSCULAR | Status: DC
  Administered 2013-02-10 – 2013-02-11 (×4): 5000 [IU] via SUBCUTANEOUS
  Filled 2013-02-10 (×7): qty 1

## 2013-02-10 MED ORDER — SIMVASTATIN 10 MG PO TABS
10.0000 mg | ORAL_TABLET | Freq: Every day | ORAL | Status: DC
  Filled 2013-02-10 (×2): qty 1

## 2013-02-10 MED ORDER — BIOTENE DRY MOUTH MT LIQD
15.0000 mL | Freq: Two times a day (BID) | OROMUCOSAL | Status: DC
  Administered 2013-02-10 – 2013-02-11 (×2): 15 mL via OROMUCOSAL

## 2013-02-10 MED ORDER — AMLODIPINE BESYLATE 5 MG PO TABS
5.0000 mg | ORAL_TABLET | Freq: Every day | ORAL | Status: DC
  Administered 2013-02-11: 5 mg via ORAL
  Filled 2013-02-10 (×2): qty 1

## 2013-02-10 MED ORDER — DEXTROSE-NACL 5-0.9 % IV SOLN
INTRAVENOUS | Status: DC
  Administered 2013-02-10: 05:00:00 via INTRAVENOUS
  Administered 2013-02-11: 50 mL/h via INTRAVENOUS

## 2013-02-10 NOTE — Progress Notes (Signed)
Tried to call family about patient's condition and obtain more information regarding code status but reached the voicemail.  Will have nursing try to contact family and ask code status.  We are still obtaining further information regarding test results for "unresponsiveness."  When I walked into the room patient was arousable. I do not know what her baseline is however.  Our team to reassess next am.  If back to baseline and no seizure like activity may consider d/c back to facility next am and would recommend cessation of benzodiazepine and/or tramadol.  Katie Gibson  Will discontinue senna

## 2013-02-10 NOTE — Progress Notes (Signed)
Pt is A/O to self and able to maintain a conversation with RN.  Pt. Stated she feels comfortable taking pills, has never had a problem.  MD paged about NPO order and MD stated per phone "okay to give medication with sips of water".  RN will continue to monitor, call light in reach.   Thane Edu, RN

## 2013-02-10 NOTE — Procedures (Signed)
ELECTROENCEPHALOGRAM REPORT   Patient: Katie Gibson       Room #: 2X52 EEG No. ID: [id number] Age: 79 y.o.        Sex: female Referring Physician: Cena Benton Report Date:  02/10/2013        Interpreting Physician: Thana Farr D  History: Katie Gibson is an 79 y.o. female found unresponsive  Medications:  Scheduled: . amLODipine  5 mg Oral Daily  . antiseptic oral rinse  15 mL Mouth Rinse BID  . aspirin EC  81 mg Oral Daily  . donepezil  10 mg Oral QHS  . heparin  5,000 Units Subcutaneous Q8H  . insulin aspart  0-9 Units Subcutaneous Q4H  . memantine  10 mg Oral BID  . simvastatin  10 mg Oral q1800  . sodium chloride  3 mL Intravenous Q12H  . thiamine  100 mg Intravenous Daily    Conditions of Recording:  This is a 16 channel EEG carried out with the patient in the drowsy and asleep state.  Description:  The patient is not fully awake during the tracing for wakefulness to be evaluated.   The patient drowses briefly during the tracing with a slow background activity that consists mostly of theta rhythms.   The patient goes in to a light sleep with symmetrical sleep spindles, vertex central sharp transients and irregular slow activity.  Hyperventilation and intermittent photic stimulation were not performed.  IMPRESSION: This is a normal drowsy and asleep EEG.  No epileptiform activity is noted.     Thana Farr, MD [facility] [phone] 02/10/2013, 1:24 PM

## 2013-02-10 NOTE — Progress Notes (Signed)
EEG Completed; Results Pending  

## 2013-02-10 NOTE — Progress Notes (Signed)
Clinical Social Work Department BRIEF PSYCHOSOCIAL ASSESSMENT 02/10/2013  Patient:  Katie Gibson, Katie Gibson     Account Number:  [id number]     Admit date:  02/09/2013  Clinical Social Worker:  Carren Rang  Date/Time:  02/10/2013 03:34 PM  Referred by:  RN  Date Referred:  02/10/2013 Referred for  SNF Placement   Other Referral:   Interview type:  Family Other interview type:   CSW left voicemail for patient's daughter.    PSYCHOSOCIAL DATA Living Status:  FACILITY Admitted from facility:  [facility] Level of care:  Skilled Nursing Facility Primary support name:  Lowella Bandy Primary support relationship to patient:  CHILD, ADULT Degree of support available:   Per RN and SNF, daughters are supportive.    CURRENT CONCERNS Current Concerns  Post-Acute Placement   Other Concerns:    SOCIAL WORK ASSESSMENT / PLAN CSW received consult that patient is a long term resident at [facility]. CSW called [facility] to confirm. CSW then attempted to call daughter, left voicemail. Awaiting returned call.   Assessment/plan status:  Psychosocial Support/Ongoing Assessment of Needs Other assessment/ plan:   Information/referral to community resources:   csw contact information    PATIENT'S/FAMILY'S RESPONSE TO PLAN OF CARE: CSW awaiting patient's family phone call.        Maree Krabbe, MSW, [facility] [phone]

## 2013-02-10 NOTE — Progress Notes (Signed)
Utilization review completed. Raphael Espe, RN, BSN. 

## 2013-02-11 DIAGNOSIS — I6350 Cerebral infarction due to unspecified occlusion or stenosis of unspecified cerebral artery: Secondary | ICD-10-CM

## 2013-02-11 LAB — BASIC METABOLIC PANEL
CO2: 25 mEq/L (ref 19–32)
Calcium: 8.4 mg/dL (ref 8.4–10.5)
GFR calc Af Amer: 60 mL/min — ABNORMAL LOW (ref >90)
GFR calc non Af Amer: 52 mL/min — ABNORMAL LOW (ref >90)
Sodium: 143 mEq/L (ref 135–145)

## 2013-02-11 LAB — URINE CULTURE: Colony Count: NO GROWTH

## 2013-02-11 LAB — GLUCOSE, CAPILLARY
Glucose-Capillary: 86 mg/dL (ref 70–99)
Glucose-Capillary: 99 mg/dL (ref 70–99)
Glucose-Capillary: 79 mg/dL (ref 70–99)

## 2013-02-11 MED ORDER — SIMVASTATIN 10 MG PO TABS: 10.0000 mg | ORAL_TABLET | Freq: Every day | ORAL | Status: DC

## 2013-02-11 MED ORDER — AMLODIPINE BESYLATE 5 MG PO TABS: 5.0000 mg | ORAL_TABLET | Freq: Every day | ORAL | Status: DC

## 2013-02-11 MED ORDER — TRAMADOL HCL 50 MG PO TABS: ORAL_TABLET | ORAL | Status: AC

## 2013-02-11 NOTE — Progress Notes (Signed)
Clinical Social Worker facilitated patient discharge by contacting the patient, family and facility, [facility]. Patient's daughter agreeable to this plan and arranging transport via EMS . CSW will sign off, as social work intervention is no longer needed.  Maree Krabbe, MSW, [facility] [phone]

## 2013-02-11 NOTE — Progress Notes (Addendum)
10:53 am: Patients daughter called CSW back and stated it was okay for patient to go to back to [facility] whenever she is medically ready.   CSW attempted to call patient's daughter again, no answer. CSW left voicemail.  Maree Krabbe, MSW, [facility] [phone]

## 2013-02-11 NOTE — Progress Notes (Signed)
Pt stated " I need to pee".  RN educated pt that she has a foley and that she can just pee.  As the pt was repositioning herself back in bed the RN realized pt had already urinated in the bed and pulled out cathter. Cathther tip was in tact and and bulb was fully inflated on inspection.  Information was passed on in report to day shift RN to continue to monitor.  Pt is A/O to self.  Call light in reach.   Thane Edu, RN

## 2013-02-11 NOTE — Discharge Summary (Signed)
Physician Discharge Summary  Katie Gibson ZOX:[id number] DOB: 07-11-1910 DOA: 02/09/2013  PCP: Katie Hanks, MD  Admit date: 02/09/2013 Discharge date: 02/11/2013  Time spent: >30 minutes  Recommendations for Outpatient Follow-up:  1. BMET to follow electrolytes and renal function 2. No benzodiazepines   Discharge Diagnoses:  Active Problems:   Acute encephalopathy   Hypertension   CVA (cerebral infarction)   Dementia without behavioral disturbance   Unresponsiveness   Discharge Condition: stable and improved. Mentation back to baseline. Will discharge back to SNF  Diet recommendation: heart healthy diet; dysphagia 3 with thin liquids  Filed Weights   02/10/13 0034  Weight: 57.6 kg (126 lb 15.8 oz)    History of present illness:  79 y.o. female with hx of Dementia, depression, HTN, hx of DVT, CAD, a nursing home resident, with functional baseline status able to play cards, brought to the ER with unresponsiveness. In the ER she has pinpoint pupil and was not responding except to noxious stimuli. I was attempting several times to contact her family but unable. Review of the record showed that she had an exact event before, and it was felt to be multifactorial including medication, dementia, and ? Infection. Work up in the ER today included a head CT which showed no acute process, a CXR with some vascular congestion, and normal WBC, electrolytes, renal and liver fx tests, along with negative UA except trace leukocytes. Her tylenol, ASA levels and drug screen was negative. Hospitalist was asked to admit her for unresponsiveness.    Hospital Course:  1-acute encephalopathy/unresponsiveness: appears to be secondary to medications (bezodiazipenes). -CT head negative -EEG negative -blood work and UA WNL -will discontinue benzodiazepines -very limited use of tramadol for OA pain  2-HTN: continue norvasc  3-HLD: continue statins  4-OA: continue PRN tramadol for severe  pain and mainly tylenol as needed.  5-dementia: continue aricept, namenda and   Supportive care  6-hx oc CVA: no new deficit. -continue ASA for secondary prevention  Procedures:  See below for x-ray reports  EEG w/o seizure activity  CT head negative for acute intracranial abnormalities  Consultations:  None   Discharge Exam: Filed Vitals:   02/11/13 0442  BP: 172/58  Pulse: 69  Temp: 98.9 F (37.2 C)  Resp: 18    General: patient now confuse, but probably at baseline as per daughter report. She is able to follow commands and ambulating inside the room Cardiovascular: S1 and S2, no rubs or gallops Respiratory: CTA bilaterally Abd: soft, NT, ND, positive BS  Discharge Instructions  Discharge Orders   Future Orders Complete By Expires   Diet - low sodium heart healthy  As directed    Discharge instructions  As directed    Comments:     Dysphagia 3 diet with thin liquids Please maintain patient well hydrated Avoid as much as possible benzodiazepines and/or narcotics Tylenol as needed for pain as first choice       Medication List    STOP taking these medications       LORazepam 0.5 MG tablet  Commonly known as:  ATIVAN      TAKE these medications       amLODipine 5 MG tablet  Commonly known as:  NORVASC  Take 1 tablet (5 mg total) by mouth daily.     aspirin EC 81 MG tablet  Take 81 mg by mouth daily.     bisacodyl 10 MG suppository  Commonly known as:  DULCOLAX  Place 10 mg  rectally daily as needed. For constipation if mom does not work     donepezil 10 MG tablet  Commonly known as:  ARICEPT  Take 10 mg by mouth at bedtime.     magnesium hydroxide 400 MG/5ML suspension  Commonly known as:  MILK OF MAGNESIA  Take 30 mLs by mouth daily as needed. For constipation     memantine 10 MG tablet  Commonly known as:  NAMENDA  Take 10 mg by mouth 2 (two) times daily.     RA SALINE ENEMA 19-7 GM/118ML Enem  Place 1 enema rectally daily as needed.  For constipation when mom, and bisacodyl supp does not work     senna-docusate 8.6-50 MG per tablet  Commonly known as:  Senokot-S  Take 2 tablets by mouth daily.     simvastatin 10 MG tablet  Commonly known as:  ZOCOR  Take 1 tablet (10 mg total) by mouth daily at 6 PM.     traMADol 50 MG tablet  Commonly known as:  ULTRAM  Take 1 tablet by mouth every 8 hours as needed for pain     VITAMIN B 12 PO  Take 1,000 mg by mouth daily.       No Known Allergies     Follow-up Information   Follow up with Katie Hanks, MD. Schedule an appointment as soon as possible for a visit in 1 week.   Specialty:  Internal Medicine   Contact information:   [address] [place] [address] [phone]       The results of significant diagnostics from this hospitalization (including imaging, microbiology, ancillary and laboratory) are listed below for reference.    Significant Diagnostic Studies: Ct Head Wo Contrast  02/09/2013   CLINICAL DATA:  Altered mental status.  Drug overdose.  Fatigue.  EXAM: CT HEAD WITHOUT CONTRAST  TECHNIQUE: Contiguous axial images were obtained from the base of the skull through the vertex without intravenous contrast.  COMPARISON:  MRI 10/03/2011.  FINDINGS: No mass lesion, mass effect, midline shift, hydrocephalus, hemorrhage. No acute territorial cortical ischemia/infarct. Atrophy and chronic ischemic white matter disease is present. Benign basal ganglia calcifications are present. Calvarium intact. Chronic right sphenoid sinus this with mucoperiosteal thickening. Intracranial atherosclerosis.  IMPRESSION: Atrophy and chronic ischemic white matter disease without acute intracranial abnormality. Chronic right sphenoid sinusitis.   Electronically Signed   By: Katie Gibson M.D.   On: 02/09/2013 20:40   Dg Chest Portable 1 View  02/09/2013   CLINICAL DATA:  Drug overdose.  EXAM: PORTABLE CHEST - 1 VIEW  COMPARISON:  10/02/2011  FINDINGS: Cardiomegaly.  Tortuosity and ectasia of the thoracic aorta. Prominence soft tissue in the right lower mediastinum may reflect aneurysmal dilatation of the aorta. This is stable since prior study. No confluent airspace opacities or effusions. No acute bony abnormality.  IMPRESSION: Cardiomegaly, vascular congestion.  Question aneurysmal dilatation of the ascending thoracic aorta, stable.   Electronically Signed   By: Charlett Nose M.D.   On: 02/09/2013 20:14    Microbiology: Recent Results (from the past 240 hour(s))  MRSA PCR SCREENING     Status: Abnormal   Collection Time    02/10/13  1:19 PM      Result Value Range Status   MRSA by PCR POSITIVE (*) NEGATIVE Final   Comment:            The GeneXpert MRSA Assay (FDA     approved for NASAL specimens     only),  is one component of a     comprehensive MRSA colonization     surveillance program. It is not     intended to diagnose MRSA     infection nor to guide or     monitor treatment for     MRSA infections.     RESULT CALLED TO, READ BACK BY AND VERIFIED WITH:     J GRAY,RN 02/10/13 1522 BY K SCHULTZ     Labs: Basic Metabolic Panel:  Recent Labs Lab 02/09/13 1956 02/10/13 0130 02/11/13 0508  NA 137  --  143  K 5.1  --  4.0  CL 104  --  111  CO2 25  --  25  GLUCOSE 91  --  91  BUN 26*  --  22  CREATININE 1.01 1.05 0.88  CALCIUM 8.5  --  8.4   Liver Function Tests:  Recent Labs Lab 02/09/13 1956  AST 20  ALT 10  ALKPHOS 88  BILITOT 0.2*  PROT 6.4  ALBUMIN 3.0*    Recent Labs Lab 02/10/13 0119  AMMONIA 10*   CBC:  Recent Labs Lab 02/09/13 1956 02/10/13 0130  WBC 6.6 7.7  NEUTROABS 4.2  --   HGB 13.1 12.9  HCT 39.9 39.9  MCV 89.1 89.3  PLT 211 208   Cardiac Enzymes:  Recent Labs Lab 02/09/13 1956  TROPONINI <0.30   CBG:  Recent Labs Lab 02/10/13 2048 02/10/13 2357 02/11/13 0442 02/11/13 0753 02/11/13 1121  GLUCAP 85 86 99 94 79    Signed:  Keyra Virella  [facility] 02/11/2013, 11:53  AM

## 2013-02-16 ENCOUNTER — Encounter: Payer: Self-pay | Admitting: Internal Medicine

## 2013-02-16 ENCOUNTER — Non-Acute Institutional Stay (SKILLED_NURSING_FACILITY): Payer: Medicare Other | Admitting: Internal Medicine

## 2013-02-16 DIAGNOSIS — M1990 Unspecified osteoarthritis, unspecified site: Secondary | ICD-10-CM

## 2013-02-16 DIAGNOSIS — R4189 Other symptoms and signs involving cognitive functions and awareness: Secondary | ICD-10-CM

## 2013-02-16 DIAGNOSIS — I10 Essential (primary) hypertension: Secondary | ICD-10-CM

## 2013-02-16 DIAGNOSIS — I6350 Cerebral infarction due to unspecified occlusion or stenosis of unspecified cerebral artery: Secondary | ICD-10-CM

## 2013-02-16 DIAGNOSIS — I639 Cerebral infarction, unspecified: Secondary | ICD-10-CM

## 2013-02-16 DIAGNOSIS — E538 Deficiency of other specified B group vitamins: Secondary | ICD-10-CM

## 2013-02-16 DIAGNOSIS — E785 Hyperlipidemia, unspecified: Secondary | ICD-10-CM

## 2013-02-16 DIAGNOSIS — R404 Transient alteration of awareness: Secondary | ICD-10-CM

## 2013-02-16 DIAGNOSIS — F0390 Unspecified dementia without behavioral disturbance: Secondary | ICD-10-CM

## 2013-02-16 NOTE — Assessment & Plan Note (Signed)
Felt finally to be sec to benzodiazepines; neg w/u  CT head,CXR, CMP, CBC, U/A, drug screen, EEG THEREFORE NO BENZODIAZEPINES AND VERY LITTLE TRAMADOL

## 2013-02-16 NOTE — Assessment & Plan Note (Signed)
Prior-on ASA only

## 2013-02-16 NOTE — Progress Notes (Signed)
MRN: [id number] Name: Katie Gibson  Sex: female Age: 79 y.o. DOB: 10/08/1910  PSC #: [facility] Facility/Room: 221A Level Of Care: SNF Provider: Merrilee Seashore D Emergency Contacts: Extended Emergency Contact Information Primary Emergency Contact: Deveaux,Nikki Address: [address]          [place] Home Phone: [phone] Mobile Phone: [phone] Relation: Other  Code Status: FULL  Allergies: Review of patient's allergies indicates no known allergies.  Chief Complaint  Patient presents with  . nursing home admission    HPI: Patient is 79 y.o. female who presented to ED for unresponsive, felt to be benzodiazepines as extensive work up was neg. NO BENZO!!!  Past Medical History  Diagnosis Date  . Dementia   . DVT (deep venous thrombosis)   . Hypertension   . Depression   . GERD (gastroesophageal reflux disease)   . Coronary artery disease   . Hyperlipidemia     Past Surgical History  Procedure Laterality Date  . Eye surgery    . Cataract extractions with lens implantation        Medication List       This list is accurate as of: 02/16/13 11:05 AM.  Always use your most recent med list.               amLODipine 5 MG tablet  Commonly known as:  NORVASC  Take 1 tablet (5 mg total) by mouth daily.     aspirin EC 81 MG tablet  Take 81 mg by mouth daily.     bisacodyl 10 MG suppository  Commonly known as:  DULCOLAX  Place 10 mg rectally daily as needed. For constipation if mom does not work     donepezil 10 MG tablet  Commonly known as:  ARICEPT  Take 10 mg by mouth at bedtime.     magnesium hydroxide 400 MG/5ML suspension  Commonly known as:  MILK OF MAGNESIA  Take 30 mLs by mouth daily as needed. For constipation     memantine 10 MG tablet  Commonly known as:  NAMENDA  Take 10 mg by mouth 2 (two) times daily.     RA SALINE ENEMA 19-7 GM/118ML Enem  Place 1 enema rectally daily as needed.  For constipation when mom, and bisacodyl supp does not work     senna-docusate 8.6-50 MG per tablet  Commonly known as:  Senokot-S  Take 2 tablets by mouth daily.     simvastatin 10 MG tablet  Commonly known as:  ZOCOR  Take 1 tablet (10 mg total) by mouth daily at 6 PM.     traMADol 50 MG tablet  Commonly known as:  ULTRAM  Take 1 tablet by mouth every 8 hours as needed for pain     VITAMIN B 12 PO  Take 1,000 mg by mouth daily.        No orders of the defined types were placed in this encounter.    Immunization History  Administered Date(s) Administered  . Influenza-Unspecified 11/20/2012  . Pneumococcal-Unspecified 09/04/2005    History  Substance Use Topics  . Smoking status: Never Smoker   . Smokeless tobacco: Not on file  . Alcohol Use: No    Family history is noncontributory    Review of Systems  DATA OBTAINED: from patient; PT SAYS SHE HURTS FROM HEAD TO TOE- I think she means arthritis GENERAL: Feels well no fevers, fatigue, appetite changes SKIN: No itching, rash or wounds EYES: No  eye pain, redness, discharge EARS: No earache, tinnitus, change in hearing NOSE: No congestion, drainage or bleeding  MOUTH/THROAT: No mouth or tooth pain, No sore throat, No difficulty chewing or swallowing  RESPIRATORY: No cough, wheezing, SOB CARDIAC: No chest pain, palpitations, lower extremity edema  GI: No abdominal pain, No N/V/D or constipation, No heartburn or reflux  GU: No dysuria, frequency or urgency, or incontinence  MUSCULOSKELETAL: No unrelieved bone/joint pain NEUROLOGIC: No headache, dizziness or focal weakness PSYCHIATRIC: No overt anxiety or sadness. Sleeps well. No behavior issue.   Filed Vitals:   02/16/13 1014  BP: 132/80  Pulse: 70  Temp: 97.9 F (36.6 C)  Resp: 18    Physical Exam  GENERAL APPEARANCE: Asleep , awakens appropriately mod conversant. Appropriately groomed. No acute distress.  SKIN: No diaphoresis rash, or wounds HEAD:  Normocephalic, atraumatic  EYES: Conjunctiva/lids clear. Pupils round, reactive. EOMs intact.  EARS: External exam WNL, canals clear. Hearing grossly normal.  NOSE: No deformity or discharge.  MOUTH/THROAT: Lips w/o lesions.   RESPIRATORY: Breathing is even, unlabored. Lung sounds are clear   CARDIOVASCULAR: Heart RRR no murmurs, rubs or gallops. No peripheral edema VENOUS: No varicosities. No venous stasis skin changes  GASTROINTESTINAL: Abdomen is soft, non-tender, not distended w/ normal bowel sounds.  GENITOURINARY: Bladder non tender, not distended  MUSCULOSKELETAL: No abnormal joints or musculature NEUROLOGIC: Oriented X2. Cranial nerves 2-12 grossly intact. Moves all extremities no tremor. PSYCHIATRIC: Mood and affect appropriate to situation, no behavioral issues  Patient Active Problem List   Diagnosis Date Noted  . Hyperlipidemia   . Unresponsiveness 02/09/2013  . Osteoarthritis 12/23/2012  . Dementia without behavioral disturbance 11/25/2012  . Constipation 11/09/2012  . CVA (cerebral infarction) 10/03/2011  . Hypoglycemia 10/03/2011  . Low TSH level 10/03/2011  . Acute encephalopathy 10/02/2011  . Renal failure 10/02/2011  . Hypertension 10/02/2011  . History of DVT (deep vein thrombosis) 10/02/2011  . B12 deficiency 10/02/2011    CBC    Component Value Date/Time   WBC 7.7 02/10/2013 0130   RBC 4.47 02/10/2013 0130   HGB 12.9 02/10/2013 0130   HCT 39.9 02/10/2013 0130   PLT 208 02/10/2013 0130   MCV 89.3 02/10/2013 0130   LYMPHSABS 1.9 02/09/2013 1956   MONOABS 0.5 02/09/2013 1956   EOSABS 0.1 02/09/2013 1956   BASOSABS 0.0 02/09/2013 1956    CMP     Component Value Date/Time   NA 143 02/11/2013 0508   K 4.0 02/11/2013 0508   CL 111 02/11/2013 0508   CO2 25 02/11/2013 0508   GLUCOSE 91 02/11/2013 0508   BUN 22 02/11/2013 0508   CREATININE 0.88 02/11/2013 0508   CALCIUM 8.4 02/11/2013 0508   PROT 6.4 02/09/2013 1956   ALBUMIN 3.0* 02/09/2013 1956    AST 20 02/09/2013 1956   ALT 10 02/09/2013 1956   ALKPHOS 88 02/09/2013 1956   BILITOT 0.2* 02/09/2013 1956   GFRNONAA 52* 02/11/2013 0508   GFRAA 60* 02/11/2013 0508    Assessment and Plan  Unresponsiveness Felt finally to be sec to benzodiazepines; neg w/u  CT head,CXR, CMP, CBC, U/A, drug screen, EEG THEREFORE NO BENZODIAZEPINES AND VERY LITTLE TRAMADOL  Hypertension Controlled on Norvasc  Hyperlipidemia HDL 70, LDL 124 a year ago;continue zocor 10 mg;LFT's were nl this admit  Osteoarthritis Use tylenol for pain if possible with tramadol for severe because of unresponsiveness; will schedule 650mg  q 4  for 4 doses while awake daily with 500 mg prn during  night  CVA (cerebral infarction) Prior-on ASA only  Dementia without behavioral disturbance Aricept and Namenda both to continue  B12 deficiency Continue B12 daily    Margit Hanks, MD

## 2013-02-16 NOTE — Assessment & Plan Note (Signed)
Controlled on Norvasc

## 2013-02-16 NOTE — Assessment & Plan Note (Signed)
Aricept and Namenda both to continue

## 2013-02-16 NOTE — Assessment & Plan Note (Signed)
HDL 70, LDL 124 a year ago;continue zocor 10 mg;LFT's were nl this admit

## 2013-02-16 NOTE — Assessment & Plan Note (Addendum)
Use tylenol for pain if possible with tramadol for severe because of unresponsiveness; will schedule 650mg  q 4  for 4 doses while awake daily with 500 mg prn during  night

## 2013-02-16 NOTE — Assessment & Plan Note (Signed)
Continue B12 daily

## 2013-03-06 ENCOUNTER — Encounter: Payer: Self-pay | Admitting: Nurse Practitioner

## 2013-03-06 ENCOUNTER — Non-Acute Institutional Stay (SKILLED_NURSING_FACILITY): Payer: Medicare Other | Admitting: Nurse Practitioner

## 2013-03-06 DIAGNOSIS — I6350 Cerebral infarction due to unspecified occlusion or stenosis of unspecified cerebral artery: Secondary | ICD-10-CM

## 2013-03-06 DIAGNOSIS — K5900 Constipation, unspecified: Secondary | ICD-10-CM

## 2013-03-06 DIAGNOSIS — M1990 Unspecified osteoarthritis, unspecified site: Secondary | ICD-10-CM

## 2013-03-06 DIAGNOSIS — G9340 Encephalopathy, unspecified: Secondary | ICD-10-CM

## 2013-03-06 DIAGNOSIS — F0390 Unspecified dementia without behavioral disturbance: Secondary | ICD-10-CM

## 2013-03-06 DIAGNOSIS — I10 Essential (primary) hypertension: Secondary | ICD-10-CM

## 2013-03-06 DIAGNOSIS — I639 Cerebral infarction, unspecified: Secondary | ICD-10-CM

## 2013-03-06 NOTE — Progress Notes (Signed)
Patient ID: Katie Gibson, female   DOB: 1910-05-22, [age] y.o.   MRN: [id number]    Nursing Home Location:  [facility]   Place of Service: SNF (31)  PCP: Margit HanksALEXANDER, ANNE D, MD  No Known Allergies  Chief Complaint  Patient presents with  . Medical Managment of Chronic Issues    HPI:  Pt is a 80 year old female who is a long term resident of [facility] seen today for a routine visit. Pt with hx of hyperlipidemia, anemia, anxiety, alzheimer's, htn, OA and constipation. Pt with dementia does not have any complaints and staff without any concerns at this time; pt recently hospitalized for acute encephalopathy; this has resolved and no other issues  REASSESSMENT OF ONGOING PROBLEMS:   HYPERLIPIDEMIA  zocor 10mg  daily   OTHER VITAMIN B12 DEFICIENCY ANEMIA stable; takes vit b12 1000 mcg daily   ALZHEIMER'S The Alzheimer's disease has not changed.No complications noted from the medication presently being used. The patient has had little change in behavior in the last months; pt is taking aricept 10 mg daily takes namenda daily   HTN UNSPECIFIED. No complications noted from the medication presently being used. takes norvasc 5 mg daily; takes asa 81 mg daily   CONSTIPATION The symptoms are stable.The medication is well tolerated.takes senna s 2 tabs daily   OSTEOARTHRITIS The patient has mild joint pain.No complications noted from the medication presently being used.  Review of Systems:  Review of Systems  Constitutional: Negative for fever, chills and weight loss.  Respiratory: Negative for cough and shortness of breath.   Cardiovascular: Negative for chest pain.  Gastrointestinal: Negative for abdominal pain, diarrhea and constipation.  Genitourinary: Negative for dysuria.  Musculoskeletal: Positive for myalgias.  Neurological: Negative for weakness and headaches.  Psychiatric/Behavioral: Positive for memory loss.    Past Medical History  Diagnosis Date  .  Dementia   . DVT (deep venous thrombosis)   . Hypertension   . Depression   . GERD (gastroesophageal reflux disease)   . Coronary artery disease   . Hyperlipidemia    Past Surgical History  Procedure Laterality Date  . Eye surgery    . Cataract extractions with lens implantation     Social History:   reports that she has never smoked. She does not have any smokeless tobacco history on file. She reports that she does not drink alcohol or use illicit drugs.  Family History  Problem Relation Age of Onset  . Stroke Daughter     Medications: Patient's Medications  New Prescriptions   No medications on file  Previous Medications   AMLODIPINE (NORVASC) 5 MG TABLET    Take 1 tablet (5 mg total) by mouth daily.   ASPIRIN EC 81 MG TABLET    Take 81 mg by mouth daily.   BISACODYL (DULCOLAX) 10 MG SUPPOSITORY    Place 10 mg rectally daily as needed. For constipation if mom does not work   CYANOCOBALAMIN (VITAMIN B 12 PO)    Take 1,000 mg by mouth daily.   DONEPEZIL (ARICEPT) 10 MG TABLET    Take 10 mg by mouth at bedtime.   MAGNESIUM HYDROXIDE (MILK OF MAGNESIA) 400 MG/5ML SUSPENSION    Take 30 mLs by mouth daily as needed. For constipation   MEMANTINE (NAMENDA) 10 MG TABLET    Take 10 mg by mouth 2 (two) times daily.   SENNA-DOCUSATE (SENOKOT-S) 8.6-50 MG PER TABLET    Take 2 tablets by mouth daily.  SIMVASTATIN (ZOCOR) 10 MG TABLET    Take 1 tablet (10 mg total) by mouth daily at 6 PM.   SODIUM PHOSPHATES (RA SALINE ENEMA) 19-7 GM/118ML ENEM    Place 1 enema rectally daily as needed. For constipation when mom, and bisacodyl supp does not work   TRAMADOL (ULTRAM) 50 MG TABLET    Take 1 tablet by mouth every 8 hours as needed for pain  Modified Medications   No medications on file  Discontinued Medications   No medications on file     Physical Exam:  Filed Vitals:   03/06/13 1303  BP: 124/66  Pulse: 64  Temp: 97.7 F (36.5 C)  Resp: 20   Physical Exam  Vitals  reviewed. Constitutional: She is well-developed, well-nourished, and in no distress. No distress.  HENT:  Head: Normocephalic and atraumatic.  Mouth/Throat: Oropharynx is clear and moist.  Eyes: Conjunctivae and EOM are normal. Pupils are equal, round, and reactive to light.  Neck: Normal range of motion. Neck supple.  Cardiovascular: Normal rate, regular rhythm and normal heart sounds.   Pulmonary/Chest: Effort normal and breath sounds normal. No respiratory distress.  Abdominal: Soft. Bowel sounds are normal.  Musculoskeletal: Normal range of motion. She exhibits no edema and no tenderness.  Neurological: She is alert.  Skin: Skin is warm and dry. She is not diaphoretic.  Psychiatric: Affect normal.      Labs reviewed: Basic Metabolic Panel:  Recent Labs  60/45/40 1956 02/10/13 0130 02/11/13 0508  NA 137  --  143  K 5.1  --  4.0  CL 104  --  111  CO2 25  --  25  GLUCOSE 91  --  91  BUN 26*  --  22  CREATININE 1.01 1.05 0.88  CALCIUM 8.5  --  8.4   Liver Function Tests:  Recent Labs  02/09/13 1956  AST 20  ALT 10  ALKPHOS 88  BILITOT 0.2*  PROT 6.4  ALBUMIN 3.0*   No results found for this basename: LIPASE, AMYLASE,  in the last 8760 hours  Recent Labs  02/10/13 0119  AMMONIA 10*   CBC:  Recent Labs  02/09/13 1956 02/10/13 0130  WBC 6.6 7.7  NEUTROABS 4.2  --   HGB 13.1 12.9  HCT 39.9 39.9  MCV 89.1 89.3  PLT 211 208   Cardiac Enzymes:  Recent Labs  02/09/13 1956  TROPONINI <0.30   BNP: No components found with this basename: POCBNP,  CBG:  Recent Labs  02/11/13 0442 02/11/13 0753 02/11/13 1121  GLUCAP 99 94 79   TSH:  Recent Labs  02/10/13 0130  TSH 0.478   A1C: Lab Results  Component Value Date   HGBA1C 5.4 10/04/2011     Assessment/Plan 1. CVA (cerebral infarction) -conts on ASA   2. Dementia without behavioral disturbance -conts on namenda and aricept  -pt remains stable in current environment  3.  Osteoarthritis -has tylenol as needed for OA  4. Hypertension - Patients blood pressure is stable; Will monitor and make changes as necessary  5. Constipation -Patient is stable; continue current regimen. Will monitor and make changes as necessary.  6. Acute encephalopathy -resolved   7. Hyperlipidemia -will dc zocor at this time

## 2013-04-10 ENCOUNTER — Non-Acute Institutional Stay (SKILLED_NURSING_FACILITY): Payer: Medicare Other | Admitting: Nurse Practitioner

## 2013-04-10 ENCOUNTER — Encounter: Payer: Self-pay | Admitting: Nurse Practitioner

## 2013-04-10 DIAGNOSIS — E785 Hyperlipidemia, unspecified: Secondary | ICD-10-CM

## 2013-04-10 DIAGNOSIS — I639 Cerebral infarction, unspecified: Secondary | ICD-10-CM

## 2013-04-10 DIAGNOSIS — E538 Deficiency of other specified B group vitamins: Secondary | ICD-10-CM

## 2013-04-10 DIAGNOSIS — F0390 Unspecified dementia without behavioral disturbance: Secondary | ICD-10-CM

## 2013-04-10 DIAGNOSIS — M1990 Unspecified osteoarthritis, unspecified site: Secondary | ICD-10-CM

## 2013-04-10 DIAGNOSIS — K5900 Constipation, unspecified: Secondary | ICD-10-CM

## 2013-04-10 DIAGNOSIS — I10 Essential (primary) hypertension: Secondary | ICD-10-CM

## 2013-04-10 DIAGNOSIS — I6350 Cerebral infarction due to unspecified occlusion or stenosis of unspecified cerebral artery: Secondary | ICD-10-CM

## 2013-04-10 NOTE — Progress Notes (Signed)
Patient ID: Katie Gibson, female   DOB: [birth year], [age] y.o.   MRN: [id number]    Nursing Home Location:  [facility]   Place of Service: SNF (31)  PCP: Margit HanksALEXANDER, ANNE D, MD  No Known Allergies  Chief Complaint  Patient presents with  . Medical Managment of Chronic Issues    HPI:  Pt is a 80 year old female who is a long term resident of [facility] seen today for a routine visit. Pt with hx of hyperlipidemia, anemia, anxiety, alzheimer's, htn, OA and constipation. There has been no changes in the last month; staff without concerns at this time, pt has no complaints REASSESSMENT OF ONGOING PROBLEMS:  HYPERLIPIDEMIA zocor 10mg  daily  OTHER VITAMIN B12 DEFICIENCY ANEMIA stable; takes vit b12 1000 mcg daily  ALZHEIMER'S The Alzheimer's disease has not changed.No complications noted from the medication presently being used. The patient has had little change in behavior in the last months; pt is taking aricept 10 mg daily takes namenda BID HTN UNSPECIFIED. No complications noted from the medication presently being used. takes norvasc 5 mg daily; takes asa 81 mg daily  CONSTIPATION The symptoms are stable.The medication is well tolerated.takes senna s 2 tabs daily  OSTEOARTHRITIS The patient has mild joint pain; taking scheduled tylenol.No complications noted from the medication presently being used.   Review of Systems:  Review of Systems  Constitutional: Negative for fever, chills and weight loss.  Respiratory: Negative for cough and shortness of breath.   Cardiovascular: Negative for chest pain.  Gastrointestinal: Negative for abdominal pain, diarrhea and constipation.  Genitourinary: Negative for dysuria.  Musculoskeletal: Negative for myalgias.  Skin: Negative.   Neurological: Negative for dizziness, sensory change, focal weakness, weakness and headaches.  Psychiatric/Behavioral: Positive for memory loss.     Past Medical History  Diagnosis Date  .  Dementia   . DVT (deep venous thrombosis)   . Hypertension   . Depression   . GERD (gastroesophageal reflux disease)   . Coronary artery disease   . Hyperlipidemia   . History of DVT (deep vein thrombosis) 10/02/2011  . Low TSH level 10/03/2011   Past Surgical History  Procedure Laterality Date  . Eye surgery    . Cataract extractions with lens implantation     Social History:   reports that she has never smoked. She does not have any smokeless tobacco history on file. She reports that she does not drink alcohol or use illicit drugs.  Family History  Problem Relation Age of Onset  . Stroke Daughter     Medications: Patient's Medications  New Prescriptions   No medications on file  Previous Medications   AMLODIPINE (NORVASC) 5 MG TABLET    Take 1 tablet (5 mg total) by mouth daily.   ASPIRIN EC 81 MG TABLET    Take 81 mg by mouth daily.   BISACODYL (DULCOLAX) 10 MG SUPPOSITORY    Place 10 mg rectally daily as needed. For constipation if mom does not work   CYANOCOBALAMIN (VITAMIN B 12 PO)    Take 1,000 mg by mouth daily.   DONEPEZIL (ARICEPT) 10 MG TABLET    Take 10 mg by mouth at bedtime.   MAGNESIUM HYDROXIDE (MILK OF MAGNESIA) 400 MG/5ML SUSPENSION    Take 30 mLs by mouth daily as needed. For constipation   MEMANTINE (NAMENDA) 10 MG TABLET    Take 10 mg by mouth 2 (two) times daily.   SENNA-DOCUSATE (SENOKOT-S) 8.6-50 MG PER TABLET  Take 2 tablets by mouth daily.   SIMVASTATIN (ZOCOR) 10 MG TABLET    Take 1 tablet (10 mg total) by mouth daily at 6 PM.   SODIUM PHOSPHATES (RA SALINE ENEMA) 19-7 GM/118ML ENEM    Place 1 enema rectally daily as needed. For constipation when mom, and bisacodyl supp does not work   TRAMADOL (ULTRAM) 50 MG TABLET    Take 1 tablet by mouth every 8 hours as needed for pain  Modified Medications   No medications on file  Discontinued Medications   No medications on file     Physical Exam:  Filed Vitals:   04/10/13 1409  BP: 138/78  Pulse:  74  Resp: 20    Physical Exam  Vitals reviewed. Constitutional: She is well-developed, well-nourished, and in no distress. No distress.  HENT:  Head: Normocephalic and atraumatic.  Mouth/Throat: Oropharynx is clear and moist.  Eyes: Conjunctivae and EOM are normal. Pupils are equal, round, and reactive to light.  Neck: Normal range of motion. Neck supple.  Cardiovascular: Normal rate, regular rhythm and normal heart sounds.   Pulmonary/Chest: Effort normal and breath sounds normal. No respiratory distress.  Abdominal: Soft. Bowel sounds are normal.  Musculoskeletal: Normal range of motion. She exhibits no edema and no tenderness.  Neurological: She is alert.  Skin: Skin is warm and dry. She is not diaphoretic.  Psychiatric: Affect normal.     Labs reviewed: Basic Metabolic Panel:  Recent Labs  16/10/96 1956 02/10/13 0130 02/11/13 0508  NA 137  --  143  K 5.1  --  4.0  CL 104  --  111  CO2 25  --  25  GLUCOSE 91  --  91  BUN 26*  --  22  CREATININE 1.01 1.05 0.88  CALCIUM 8.5  --  8.4   Liver Function Tests:  Recent Labs  02/09/13 1956  AST 20  ALT 10  ALKPHOS 88  BILITOT 0.2*  PROT 6.4  ALBUMIN 3.0*   No results found for this basename: LIPASE, AMYLASE,  in the last 8760 hours  Recent Labs  02/10/13 0119  AMMONIA 10*   CBC:  Recent Labs  02/09/13 1956 02/10/13 0130  WBC 6.6 7.7  NEUTROABS 4.2  --   HGB 13.1 12.9  HCT 39.9 39.9  MCV 89.1 89.3  PLT 211 208   Cardiac Enzymes:  Recent Labs  02/09/13 1956  TROPONINI <0.30   BNP: No components found with this basename: POCBNP,  CBG:  Recent Labs  02/11/13 0442 02/11/13 0753 02/11/13 1121  GLUCAP 99 94 79   TSH:  Recent Labs  02/10/13 0130  TSH 0.478   A1C: Lab Results  Component Value Date   HGBA1C 5.4 10/04/2011     Assessment/Plan 1. Constipation -remains without complaints on current medications  2. B12 deficiency -hgb stable during hospitalization; will cont  suplements  3. Hypertension -Patient is stable; continue current regimen. Will monitor and make changes as necessary.  4. Dementia without behavioral disturbance -without change in the last month, will have pt switch from BID namenda to once daily XR 28 mg   5. Osteoarthritis -stable on current medications  6. CVA (cerebral infarction) -hx of, taking ASA daily  7. Hyperlipidemia -will follow up lipids -zocor still on MAR - will write to DC again due to age and will cont to follow

## 2013-04-13 LAB — LIPID PANEL
CHOLESTEROL: 179 mg/dL (ref 0–200)
HDL: 57 mg/dL (ref 35–70)
LDL Cholesterol: 103 mg/dL
Triglycerides: 93 mg/dL (ref 40–160)

## 2013-05-05 ENCOUNTER — Encounter: Payer: Self-pay | Admitting: Nurse Practitioner

## 2013-05-05 ENCOUNTER — Non-Acute Institutional Stay (SKILLED_NURSING_FACILITY): Payer: Medicare Other | Admitting: Nurse Practitioner

## 2013-05-05 DIAGNOSIS — M1990 Unspecified osteoarthritis, unspecified site: Secondary | ICD-10-CM

## 2013-05-05 DIAGNOSIS — F0390 Unspecified dementia without behavioral disturbance: Secondary | ICD-10-CM

## 2013-05-05 DIAGNOSIS — I10 Essential (primary) hypertension: Secondary | ICD-10-CM

## 2013-05-05 DIAGNOSIS — K5900 Constipation, unspecified: Secondary | ICD-10-CM

## 2013-05-05 DIAGNOSIS — E785 Hyperlipidemia, unspecified: Secondary | ICD-10-CM

## 2013-05-05 NOTE — Progress Notes (Signed)
Patient ID: Katie Gibson, female   DOB: [birth year], [age] y.o.   MRN: [id number]    [facility]   Place of Service: SNF (31)  PCP: Margit HanksALEXANDER, ANNE D, MD  No Known Allergies  Chief Complaint  Patient presents with  . Medical Managment of Chronic Issues    HPI:  Pt is a 80 year old female who is a long term resident of [facility] seen today for a routine visit. Pt with hx of hyperlipidemia, anemia, anxiety, alzheimer's, htn, OA and constipation. In the last month pt had a fall and had pain  right hip; xray was negative and since fall pt without pain and has not had any other symptoms, doing well and without complaints   REASSESSMENT OF ONGOING PROBLEMS:  HYPERLIPIDEMIA - currently zocor has been dc'd due to age, pt also with generalized muscle pains at time which statin could have contributed to  OTHER VITAMIN B12 DEFICIENCY ANEMIA stable, cbc in hospital stable; takes vit b12 1000 mcg daily  ALZHEIMER'S The Alzheimer's disease has not changed.No complications noted from the medication presently being used. The patient has had little change in behavior in the last months; pt is taking aricept 10 mg daily takes namenda BID  HTN UNSPECIFIED. No complications noted from the medication presently being used. takes norvasc 5 mg daily; takes asa 81 mg daily  CONSTIPATION The symptoms are stable.The medication is well tolerated.takes senna s 2 tabs daily  OSTEOARTHRITIS The patient has mild joint pain; taking scheduled tylenol.No complications noted from the medication presently being used. Review of Systems:  Review of Systems  Constitutional: Negative for fever, chills and weight loss.  HENT: Negative for sore throat.   Respiratory: Negative for cough and shortness of breath.   Cardiovascular: Negative for chest pain.  Gastrointestinal: Negative for abdominal pain, diarrhea and constipation.  Genitourinary: Negative for dysuria.  Musculoskeletal:  Negative for myalgias.  Skin: Negative.   Neurological: Negative for dizziness, sensory change, focal weakness, weakness and headaches.  Psychiatric/Behavioral: Positive for memory loss. Negative for depression.     Past Medical History  Diagnosis Date  . Dementia   . DVT (deep venous thrombosis)   . Hypertension   . Depression   . GERD (gastroesophageal reflux disease)   . Coronary artery disease   . Hyperlipidemia   . History of DVT (deep vein thrombosis) 10/02/2011  . Low TSH level 10/03/2011   Past Surgical History  Procedure Laterality Date  . Eye surgery    . Cataract extractions with lens implantation     Social History:   reports that she has never smoked. She does not have any smokeless tobacco history on file. She reports that she does not drink alcohol or use illicit drugs.  Family History  Problem Relation Age of Onset  . Stroke Daughter     Medications: Patient's Medications  New Prescriptions   No medications on file  Previous Medications   AMLODIPINE (NORVASC) 5 MG TABLET    Take 1 tablet (5 mg total) by mouth daily.   ASPIRIN EC 81 MG TABLET    Take 81 mg by mouth daily.   BISACODYL (DULCOLAX) 10 MG SUPPOSITORY    Place 10 mg rectally daily as needed. For constipation if mom does not work   CYANOCOBALAMIN (VITAMIN B 12 PO)    Take 1,000 mg by mouth daily.   DONEPEZIL (ARICEPT) 10 MG TABLET    Take 10 mg by mouth at bedtime.  MAGNESIUM HYDROXIDE (MILK OF MAGNESIA) 400 MG/5ML SUSPENSION    Take 30 mLs by mouth daily as needed. For constipation   MEMANTINE (NAMENDA) 10 MG TABLET    Take 10 mg by mouth 2 (two) times daily.   SENNA-DOCUSATE (SENOKOT-S) 8.6-50 MG PER TABLET    Take 2 tablets by mouth daily.   SODIUM PHOSPHATES (RA SALINE ENEMA) 19-7 GM/118ML ENEM    Place 1 enema rectally daily as needed. For constipation when mom, and bisacodyl supp does not work   TRAMADOL (ULTRAM) 50 MG TABLET    Take 1 tablet by mouth every 8 hours as needed for pain    Modified Medications   No medications on file  Discontinued Medications   SIMVASTATIN (ZOCOR) 10 MG TABLET    Take 1 tablet (10 mg total) by mouth daily at 6 PM.     Physical Exam: Physical Exam  Vitals reviewed. Constitutional: She is well-developed, well-nourished, and in no distress. No distress.  HENT:  Head: Normocephalic and atraumatic.  Nose: Nose normal.  Eyes: Conjunctivae and EOM are normal. Pupils are equal, round, and reactive to light.  Neck: Normal range of motion. Neck supple.  Cardiovascular: Normal rate, regular rhythm and normal heart sounds.   Pulmonary/Chest: Effort normal and breath sounds normal. No respiratory distress.  Abdominal: Soft. Bowel sounds are normal.  Musculoskeletal: Normal range of motion. She exhibits no edema and no tenderness.  Neurological: She is alert.  Walks with walker  Skin: Skin is warm and dry. She is not diaphoretic.  Psychiatric: Affect normal.     Filed Vitals:   05/05/13 1104  BP: 126/60  Pulse: 62  Temp: 97 F (36.1 C)  Resp: 18      Labs reviewed: Basic Metabolic Panel:  Recent Labs  16/10/96 1956 02/10/13 0130 02/11/13 0508  NA 137  --  143  K 5.1  --  4.0  CL 104  --  111  CO2 25  --  25  GLUCOSE 91  --  91  BUN 26*  --  22  CREATININE 1.01 1.05 0.88  CALCIUM 8.5  --  8.4   Liver Function Tests:  Recent Labs  02/09/13 1956  AST 20  ALT 10  ALKPHOS 88  BILITOT 0.2*  PROT 6.4  ALBUMIN 3.0*   No results found for this basename: LIPASE, AMYLASE,  in the last 8760 hours  Recent Labs  02/10/13 0119  AMMONIA 10*   CBC:  Recent Labs  02/09/13 1956 02/10/13 0130  WBC 6.6 7.7  NEUTROABS 4.2  --   HGB 13.1 12.9  HCT 39.9 39.9  MCV 89.1 89.3  PLT 211 208   Cardiac Enzymes:  Recent Labs  02/09/13 1956  TROPONINI <0.30   BNP: No components found with this basename: POCBNP,  CBG:  Recent Labs  02/11/13 0442 02/11/13 0753 02/11/13 1121  GLUCAP 99 94 79   TSH:  Recent  Labs  02/10/13 0130  TSH 0.478   A1C: Lab Results  Component Value Date   HGBA1C 5.4 10/04/2011   Lipid Profile    Result: 04/13/2013 3:00 PM   ( Status: F )       Cholesterol 179     0-200 mg/dL SLN C Triglyceride 93     <150 mg/dL SLN   HDL Cholesterol 57     >39 mg/dL SLN   Total Chol/HDL Ratio 3.1      Ratio SLN   VLDL Cholesterol (Calc) 19  0-40 mg/dL SLN   LDL Cholesterol (Calc) 103   H 0-99 mg/dL SLN C  Assessment/Plan 1. Hypertension -Patients blood pressure remains stable; continue current regimen. Will monitor and make changes as necessary.  2. Hyperlipidemia -due to pts age pt has been taking off zocor  3. Osteoarthritis -stable at this time.   4. Dementia without behavioral disturbance -advanced, but does well in current environment   5. Constipation -without complaints; conts senna-s

## 2013-05-26 ENCOUNTER — Non-Acute Institutional Stay (SKILLED_NURSING_FACILITY): Payer: Medicare Other | Admitting: Nurse Practitioner

## 2013-05-26 DIAGNOSIS — I639 Cerebral infarction, unspecified: Secondary | ICD-10-CM

## 2013-05-26 DIAGNOSIS — I6350 Cerebral infarction due to unspecified occlusion or stenosis of unspecified cerebral artery: Secondary | ICD-10-CM

## 2013-05-26 DIAGNOSIS — I10 Essential (primary) hypertension: Secondary | ICD-10-CM

## 2013-05-26 DIAGNOSIS — E538 Deficiency of other specified B group vitamins: Secondary | ICD-10-CM

## 2013-05-26 DIAGNOSIS — K5900 Constipation, unspecified: Secondary | ICD-10-CM

## 2013-05-26 DIAGNOSIS — F0390 Unspecified dementia without behavioral disturbance: Secondary | ICD-10-CM

## 2013-05-26 NOTE — Progress Notes (Signed)
Patient ID: Katie Gibson, female   DOB: 1910-05-21, 80 y.o.   MRN: [id number]    Nursing Home Location:  [facility]   Place of Service: SNF (31)  PCP: Margit Hanks, MD  No Known Allergies  Chief Complaint  Patient presents with  . Medical Managment of Chronic Issues    HPI:  Pt is a 80 year old female who is a long term resident of [facility] seen today for a routine visit. Pt with hx of hyperlipidemia, anemia, anxiety, alzheimer's, htn, OA and constipation. There has been no significant changes to pts chronic conditions in the last month, staff without any concerns at this time  Review of Systems:  Review of Systems  Constitutional: Negative for weight loss and malaise/fatigue.  HENT: Negative for sore throat.   Respiratory: Negative for cough and shortness of breath.   Cardiovascular: Negative for chest pain and leg swelling.  Gastrointestinal: Negative for abdominal pain, diarrhea and constipation.  Genitourinary: Negative for dysuria.  Musculoskeletal: Negative for myalgias.  Skin: Negative.   Neurological: Negative for dizziness, sensory change, focal weakness, weakness and headaches.  Psychiatric/Behavioral: Positive for memory loss. Negative for depression.     Past Medical History  Diagnosis Date  . Dementia   . DVT (deep venous thrombosis)   . Hypertension   . Depression   . GERD (gastroesophageal reflux disease)   . Coronary artery disease   . Hyperlipidemia   . History of DVT (deep vein thrombosis) 10/02/2011  . Low TSH level 10/03/2011   Past Surgical History  Procedure Laterality Date  . Eye surgery    . Cataract extractions with lens implantation     Social History:   reports that she has never smoked. She does not have any smokeless tobacco history on file. She reports that she does not drink alcohol or use illicit drugs.  Family History  Problem Relation Age of Onset  . Stroke Daughter     Medications: Patient's  Medications  New Prescriptions   No medications on file  Previous Medications   AMLODIPINE (NORVASC) 5 MG TABLET    Take 1 tablet (5 mg total) by mouth daily.   ASPIRIN EC 81 MG TABLET    Take 81 mg by mouth daily.   BISACODYL (DULCOLAX) 10 MG SUPPOSITORY    Place 10 mg rectally daily as needed. For constipation if mom does not work   CYANOCOBALAMIN (VITAMIN B 12 PO)    Take 1,000 mg by mouth daily.   DONEPEZIL (ARICEPT) 10 MG TABLET    Take 10 mg by mouth at bedtime.   MAGNESIUM HYDROXIDE (MILK OF MAGNESIA) 400 MG/5ML SUSPENSION    Take 30 mLs by mouth daily as needed. For constipation   MEMANTINE (NAMENDA) 10 MG TABLET    Take 10 mg by mouth 2 (two) times daily.   SENNA-DOCUSATE (SENOKOT-S) 8.6-50 MG PER TABLET    Take 2 tablets by mouth daily.   SODIUM PHOSPHATES (RA SALINE ENEMA) 19-7 GM/118ML ENEM    Place 1 enema rectally daily as needed. For constipation when mom, and bisacodyl supp does not work   TRAMADOL (ULTRAM) 50 MG TABLET    Take 1 tablet by mouth every 8 hours as needed for pain  Modified Medications   No medications on file  Discontinued Medications   No medications on file     Physical Exam:  Filed Vitals:   05/26/13 1434  BP: 166/53  Pulse: 73  Temp: 98.3 F (36.8  C)  Resp: 20   Physical Exam  Vitals reviewed. Constitutional: She is well-developed, well-nourished, and in no distress. No distress.  HENT:  Head: Normocephalic and atraumatic.  Nose: Nose normal.  Mouth/Throat: Oropharynx is clear and moist. No oropharyngeal exudate.  Eyes: Conjunctivae and EOM are normal. Pupils are equal, round, and reactive to light.  Neck: Normal range of motion. Neck supple. No JVD present.  Cardiovascular: Normal rate, regular rhythm and normal heart sounds.   Pulmonary/Chest: Effort normal and breath sounds normal. No respiratory distress.  Abdominal: Soft. Bowel sounds are normal.  Musculoskeletal: Normal range of motion. She exhibits no edema and no tenderness.    Neurological: She is alert.  Walks with walker  Skin: Skin is warm and dry. She is not diaphoretic.  Psychiatric: Affect normal.      Labs reviewed: Basic Metabolic Panel:  Recent Labs  40/98/1112/22/14 1956 02/10/13 0130 02/11/13 0508  NA 137  --  143  K 5.1  --  4.0  CL 104  --  111  CO2 25  --  25  GLUCOSE 91  --  91  BUN 26*  --  22  CREATININE 1.01 1.05 0.88  CALCIUM 8.5  --  8.4   Liver Function Tests:  Recent Labs  02/09/13 1956  AST 20  ALT 10  ALKPHOS 88  BILITOT 0.2*  PROT 6.4  ALBUMIN 3.0*   No results found for this basename: LIPASE, AMYLASE,  in the last 8760 hours  Recent Labs  02/10/13 0119  AMMONIA 10*   CBC:  Recent Labs  02/09/13 1956 02/10/13 0130  WBC 6.6 7.7  NEUTROABS 4.2  --   HGB 13.1 12.9  HCT 39.9 39.9  MCV 89.1 89.3  PLT 211 208   Cardiac Enzymes:  Recent Labs  02/09/13 1956  TROPONINI <0.30   BNP: No components found with this basename: POCBNP,  CBG:  Recent Labs  02/11/13 0442 02/11/13 0753 02/11/13 1121  GLUCAP 99 94 79   TSH:  Recent Labs  02/10/13 0130  TSH 0.478   A1C: Lab Results  Component Value Date   HGBA1C 5.4 10/04/2011    Assessment/Plan 1. Constipation -well controlled on current medications  2. Hypertension -Patients blood pressure elevated today; otherwise stable, will cont to monitor   3. Dementia without behavioral disturbance -pt has been stable the past few months without any acute changes, conts on aricept and namenda; will change namenda to once daily  4. CVA (cerebral infarction) -stable; conts on ASA  5. B12 deficiency -conts b12; will follow up cbc   Labs/tests ordered cmp and cbc

## 2013-05-27 LAB — HEPATIC FUNCTION PANEL
ALT: 14 U/L (ref 7–35)
AST: 16 U/L (ref 13–35)
Alkaline Phosphatase: 71 U/L (ref 25–125)
Bilirubin, Total: 0.4 mg/dL

## 2013-05-27 LAB — CBC AND DIFFERENTIAL
HCT: 34 % — AB (ref 36–46)
Hemoglobin: 11.2 g/dL — AB (ref 12.0–16.0)
Platelets: 239 K/µL (ref 150–399)
WBC: 7.2 10*3/mL

## 2013-05-27 LAB — BASIC METABOLIC PANEL
BUN: 29 mg/dL — AB (ref 4–21)
CREATININE: 0.8 mg/dL (ref <=1.1)
Glucose: 84 mg/dL
POTASSIUM: 4.1 mmol/L (ref 3.4–5.3)
Sodium: 141 mmol/L (ref 137–147)

## 2013-07-03 ENCOUNTER — Non-Acute Institutional Stay (SKILLED_NURSING_FACILITY): Payer: Medicare Other | Admitting: Nurse Practitioner

## 2013-07-03 DIAGNOSIS — E785 Hyperlipidemia, unspecified: Secondary | ICD-10-CM

## 2013-07-03 DIAGNOSIS — I10 Essential (primary) hypertension: Secondary | ICD-10-CM

## 2013-07-03 DIAGNOSIS — K5900 Constipation, unspecified: Secondary | ICD-10-CM

## 2013-07-03 DIAGNOSIS — M1990 Unspecified osteoarthritis, unspecified site: Secondary | ICD-10-CM

## 2013-07-03 DIAGNOSIS — I639 Cerebral infarction, unspecified: Secondary | ICD-10-CM

## 2013-07-03 DIAGNOSIS — I6350 Cerebral infarction due to unspecified occlusion or stenosis of unspecified cerebral artery: Secondary | ICD-10-CM

## 2013-07-03 DIAGNOSIS — F0390 Unspecified dementia without behavioral disturbance: Secondary | ICD-10-CM

## 2013-07-03 NOTE — Progress Notes (Signed)
Patient ID: Katie Gibson, female   DOB: 02/25/1910, 80 y.o.   MRN: [id number]    Nursing Home Location:  [facility]   Place of Service: SNF (31)  PCP: Margit Hanks, MD  No Known Allergies  Chief Complaint  Patient presents with  . Medical Management of Chronic Issues    HPI:  Pt is a 80 year old female who is a long term resident of [facility] seen today for a routine visit. Pt with hx of hyperlipidemia, anemia, anxiety, alzheimer's, htn, OA and constipation.pt doing well in current setting, does not have complaints today, no nursing concerns  Review of Systems:  Review of Systems  Psychiatric/Behavioral: Positive for memory loss.  All other systems reviewed and are negative. pt poor historian due to dementia    Past Medical History  Diagnosis Date  . Dementia   . DVT (deep venous thrombosis)   . Hypertension   . Depression   . GERD (gastroesophageal reflux disease)   . Coronary artery disease   . Hyperlipidemia   . History of DVT (deep vein thrombosis) 10/02/2011  . Low TSH level 10/03/2011   Past Surgical History  Procedure Laterality Date  . Eye surgery    . Cataract extractions with lens implantation     Social History:   reports that she has never smoked. She does not have any smokeless tobacco history on file. She reports that she does not drink alcohol or use illicit drugs.  Family History  Problem Relation Age of Onset  . Stroke Daughter     Medications: Patient's Medications  New Prescriptions   No medications on file  Previous Medications   ACETAMINOPHEN (TYLENOL) 650 MG CR TABLET    Take 650 mg by mouth every 4 (four) hours as needed for pain.   ASPIRIN EC 81 MG TABLET    Take 81 mg by mouth daily.   BISACODYL (DULCOLAX) 10 MG SUPPOSITORY    Place 10 mg rectally daily as needed. For constipation if mom does not work   CYANOCOBALAMIN (VITAMIN B 12 PO)    Take 1,000 mg by mouth daily. For B-12 deficiency   DONEPEZIL  (ARICEPT) 10 MG TABLET    Take 10 mg by mouth at bedtime. For dementia   MAGNESIUM HYDROXIDE (MILK OF MAGNESIA) 400 MG/5ML SUSPENSION    Take 30 mLs by mouth daily as needed. For constipation   MEMANTINE HCL ER (NAMENDA XR) 28 MG CP24    Take 1 capsule by mouth daily. For dementia   SENNA-DOCUSATE (SENOKOT-S) 8.6-50 MG PER TABLET    Take 2 tablets by mouth daily. For constipation   SODIUM PHOSPHATES (RA SALINE ENEMA) 19-7 GM/118ML ENEM    Place 1 enema rectally daily as needed. For constipation when mom, and bisacodyl supp does not work   TRAMADOL (ULTRAM) 50 MG TABLET    Take 1 tablet by mouth every 8 hours as needed for pain  Modified Medications   Modified Medication Previous Medication   AMLODIPINE (NORVASC) 5 MG TABLET amLODipine (NORVASC) 5 MG tablet      Take 5 mg by mouth daily. For HTN    Take 1 tablet (5 mg total) by mouth daily.  Discontinued Medications   MEMANTINE (NAMENDA) 10 MG TABLET    Take 10 mg by mouth 2 (two) times daily.     Physical Exam: Physical Exam  Vitals reviewed. Constitutional: She is well-developed, well-nourished, and in no distress.  HENT:  Head: Normocephalic and atraumatic.  Eyes: Conjunctivae and EOM are normal. Pupils are equal, round, and reactive to light.  Neck: Normal range of motion. Neck supple.  Cardiovascular: Normal rate, regular rhythm and normal heart sounds.   Pulmonary/Chest: Effort normal and breath sounds normal. No respiratory distress.  Abdominal: Soft. Bowel sounds are normal.  Musculoskeletal: Normal range of motion. She exhibits no edema and no tenderness.  Neurological: She is alert.  Walks with specialized walker  Skin: Skin is warm and dry. She is not diaphoretic.  Psychiatric: Affect normal.     Filed Vitals:   07/03/13 1302  BP: 168/54  Pulse: 74  Temp: 97.3 F (36.3 C)  Resp: 20  Weight: 135 lb (61.236 kg)      Labs reviewed: Basic Metabolic Panel:  Recent Labs  81/19/1412/22/14 1956 02/10/13 0130  02/11/13 0508  NA 137  --  143  K 5.1  --  4.0  CL 104  --  111  CO2 25  --  25  GLUCOSE 91  --  91  BUN 26*  --  22  CREATININE 1.01 1.05 0.88  CALCIUM 8.5  --  8.4   Liver Function Tests:  Recent Labs  02/09/13 1956  AST 20  ALT 10  ALKPHOS 88  BILITOT 0.2*  PROT 6.4  ALBUMIN 3.0*   No results found for this basename: LIPASE, AMYLASE,  in the last 8760 hours  Recent Labs  02/10/13 0119  AMMONIA 10*   CBC:  Recent Labs  02/09/13 1956 02/10/13 0130  WBC 6.6 7.7  NEUTROABS 4.2  --   HGB 13.1 12.9  HCT 39.9 39.9  MCV 89.1 89.3  PLT 211 208   Cardiac Enzymes:  Recent Labs  02/09/13 1956  TROPONINI <0.30   BNP: No components found with this basename: POCBNP,  CBG:  Recent Labs  02/11/13 0442 02/11/13 0753 02/11/13 1121  GLUCAP 99 94 79   TSH:  Recent Labs  02/10/13 0130  TSH 0.478   A1C: Lab Results  Component Value Date   HGBA1C 5.4 10/04/2011  Lipid Profile    Result: 04/13/2013 3:00 PM   ( Status: F )       Cholesterol 179     0-200 mg/dL SLN C Triglyceride 93     <150 mg/dL SLN   HDL Cholesterol 57     >39 mg/dL SLN   Total Chol/HDL Ratio 3.1      Ratio SLN   VLDL Cholesterol (Calc) 19     0-40 mg/dL SLN   LDL Cholesterol (Calc) 103   H 0-99 mg/dL SLN C CBC NO Diff (Complete Blood Count)    Result: 05/27/2013 2:18 PM   ( Status: F )     C WBC 7.2     4.0-10.5 K/uL SLN   RBC 3.99     3.87-5.11 MIL/uL SLN   Hemoglobin 11.2   L 12.0-15.0 g/dL SLN   Hematocrit 78.233.6   L 36.0-46.0 % SLN   MCV 84.2     78.0-100.0 fL SLN   MCH 28.1     26.0-34.0 pg SLN   MCHC 33.3     30.0-36.0 g/dL SLN   RDW 95.616.2   H 21.3-08.611.5-15.5 % SLN   Platelet Count 239     150-400 K/uL SLN   Comprehensive Metabolic Panel    Result: 05/27/2013 3:20 PM   ( Status: F )       Sodium 141     135-145 mEq/L SLN   Potassium 4.1  3.5-5.3 mEq/L SLN   Chloride 111     96-112 mEq/L SLN   CO2 23     19-32 mEq/L SLN   Glucose 84     70-99 mg/dL SLN   BUN 29    H 1-616-23 mg/dL SLN   Creatinine 0.960.83     0.50-1.10 mg/dL SLN   Bilirubin, Total 0.4     0.2-1.2 mg/dL SLN   Alkaline Phosphatase 71     39-117 U/L SLN   AST/SGOT 16     0-37 U/L SLN   ALT/SGPT 14     0-35 U/L SLN   Total Protein 5.4   L 6.0-8.3 g/dL SLN   Albumin 3.1   L 0.4-5.43.5-5.2 g/dL SLN   Calcium 8.0   L 0.9-81.18.4-10.5  Assessment/Plan 1. Hypertension -elevated today, appears labile, will have staff take VS daily for a month and cont to monitor, if needed will add medications  2. Constipation -without symptoms at this time  3. CVA (cerebral infarction) -continues on ASA  4. Dementia without behavioral disturbance -without significant change, still gets OOB to bathroom, dresses herself with assistance, involved in activities, conts on aricept and namenda  5. Osteoarthritis -conts on PRN tylenol, does not have any complaints today  6. Hyperlipidemia -off all medications at this time, will cont to monitor

## 2013-08-07 ENCOUNTER — Non-Acute Institutional Stay (SKILLED_NURSING_FACILITY): Payer: Medicare Other | Admitting: Nurse Practitioner

## 2013-08-07 DIAGNOSIS — M1990 Unspecified osteoarthritis, unspecified site: Secondary | ICD-10-CM

## 2013-08-07 DIAGNOSIS — F0390 Unspecified dementia without behavioral disturbance: Secondary | ICD-10-CM

## 2013-08-07 DIAGNOSIS — I10 Essential (primary) hypertension: Secondary | ICD-10-CM

## 2013-08-07 DIAGNOSIS — K5900 Constipation, unspecified: Secondary | ICD-10-CM

## 2013-08-07 NOTE — Progress Notes (Signed)
Patient ID: Katie Gibson, female   DOB: 18-May-1910, [age] y.o.   MRN: [id number]    Nursing Home Location:  [facility]   Place of Service: SNF (31)  PCP: Margit HanksALEXANDER, ANNE D, MD  No Known Allergies  Chief Complaint  Patient presents with  . Medical Management of Chronic Issues    Routine Visit     HPI:  Pt is a 80 year old female with a pmh of hyperlipidemia, anemia, anxiety, alzheimer's, htn, OA and constipation. pt is a long term resident of [facility] seen today for a routine follow up on chronic conditions.   Pt has been doing well and is without changes in the last month.   Review of Systems:  Psychiatric/Behavioral: Positive for memory loss.  All other systems reviewed and are negative.  pt poor historian due to dementia      Past Medical History  Diagnosis Date  . Dementia   . DVT (deep venous thrombosis)   . Hypertension   . Depression   . GERD (gastroesophageal reflux disease)   . Coronary artery disease   . Hyperlipidemia   . History of DVT (deep vein thrombosis) 10/02/2011  . Low TSH level 10/03/2011   Past Surgical History  Procedure Laterality Date  . Eye surgery    . Cataract extractions with lens implantation     Social History:   reports that she has never smoked. She does not have any smokeless tobacco history on file. She reports that she does not drink alcohol or use illicit drugs.  Family History  Problem Relation Age of Onset  . Stroke Daughter     Medications: Patient's Medications  New Prescriptions   No medications on file  Previous Medications   ACETAMINOPHEN (TYLENOL) 650 MG CR TABLET    Take 650 mg by mouth every 4 (four) hours as needed for pain.   AMLODIPINE (NORVASC) 5 MG TABLET    Take 5 mg by mouth daily. For HTN   ASPIRIN EC 81 MG TABLET    Take 81 mg by mouth daily.   BISACODYL (DULCOLAX) 10 MG SUPPOSITORY    Place 10 mg rectally daily as needed. For constipation if mom does not work   CYANOCOBALAMIN  (VITAMIN B 12 PO)    Take 1,000 mg by mouth daily. For B-12 deficiency   DONEPEZIL (ARICEPT) 10 MG TABLET    Take 10 mg by mouth at bedtime. For dementia   MAGNESIUM HYDROXIDE (MILK OF MAGNESIA) 400 MG/5ML SUSPENSION    Take 30 mLs by mouth daily as needed. For constipation   MEMANTINE HCL ER (NAMENDA XR) 28 MG CP24    Take 1 capsule by mouth daily. For dementia   SENNA-DOCUSATE (SENOKOT-S) 8.6-50 MG PER TABLET    Take 2 tablets by mouth daily. For constipation   SODIUM PHOSPHATES (RA SALINE ENEMA) 19-7 GM/118ML ENEM    Place 1 enema rectally daily as needed. For constipation when mom, and bisacodyl supp does not work   TRAMADOL (ULTRAM) 50 MG TABLET    Take 1 tablet by mouth every 8 hours as needed for pain  Modified Medications   No medications on file  Discontinued Medications   No medications on file     Physical Exam:  Filed Vitals:   08/07/13 1439  BP: 131/65  Pulse: 67  Temp: 97.9 F (36.6 C)  TempSrc: Oral  Resp: 20  Weight: 137 lb 3.2 oz (62.234 kg)   Physical Exam  Vitals reviewed. Constitutional:  She is well-developed, well-nourished, and in no distress.  HENT:  Head: Normocephalic and atraumatic.  Eyes: Conjunctivae and EOM are normal. Pupils are equal, round, and reactive to light.  Neck: Normal range of motion. Neck supple.  Cardiovascular: Normal rate, regular rhythm and normal heart sounds.   Pulmonary/Chest: Effort normal and breath sounds normal. No respiratory distress.  Abdominal: Soft. Bowel sounds are normal.  Musculoskeletal: Normal range of motion. She exhibits no edema and no tenderness.  Neurological: She is alert.  Walks with specialized walker, no recent falls  Skin: Skin is warm and dry. She is not diaphoretic.  Psychiatric: Affect normal.      Labs reviewed: Basic Metabolic Panel:  Recent Labs  16/11/9610/22/14 1956 02/10/13 0130 02/11/13 0508  NA 137  --  143  K 5.1  --  4.0  CL 104  --  111  CO2 25  --  25  GLUCOSE 91  --  91  BUN 26*   --  22  CREATININE 1.01 1.05 0.88  CALCIUM 8.5  --  8.4   Liver Function Tests:  Recent Labs  02/09/13 1956  AST 20  ALT 10  ALKPHOS 88  BILITOT 0.2*  PROT 6.4  ALBUMIN 3.0*   No results found for this basename: LIPASE, AMYLASE,  in the last 8760 hours  Recent Labs  02/10/13 0119  AMMONIA 10*   CBC:  Recent Labs  02/09/13 1956 02/10/13 0130  WBC 6.6 7.7  NEUTROABS 4.2  --   HGB 13.1 12.9  HCT 39.9 39.9  MCV 89.1 89.3  PLT 211 208   Cardiac Enzymes:  Recent Labs  02/09/13 1956  TROPONINI <0.30   CBG:  Recent Labs  02/11/13 0442 02/11/13 0753 02/11/13 1121  GLUCAP 99 94 79   TSH:  Recent Labs  02/10/13 0130  TSH 0.478   Lipid Profile  Result: 04/13/2013 3:00 PM ( Status: F )  Cholesterol 179 0-200 mg/dL SLN C  Triglyceride 93 <150 mg/dL SLN  HDL Cholesterol 57 >39 mg/dL SLN  Total Chol/HDL Ratio 3.1 Ratio SLN  VLDL Cholesterol (Calc) 19 0-450-40 mg/dL SLN  LDL Cholesterol (Calc) 103 H 0-99 mg/dL SLN C  CBC NO Diff (Complete Blood Count)  Result: 05/27/2013 2:18 PM ( Status: F ) C  WBC 7.2 4.0-10.5 K/uL SLN  RBC 3.99 3.87-5.11 MIL/uL SLN  Hemoglobin 11.2 L 12.0-15.0 g/dL SLN  Hematocrit 40.933.6 L 36.0-46.0 % SLN  MCV 84.2 78.0-100.0 fL SLN  MCH 28.1 26.0-34.0 pg SLN  MCHC 33.3 30.0-36.0 g/dL SLN  RDW 81.116.2 H 91.4-78.211.5-15.5 % SLN  Platelet Count 239 150-400 K/uL SLN  Comprehensive Metabolic Panel  Result: 05/27/2013 3:20 PM ( Status: F )  Sodium 141 135-145 mEq/L SLN  Potassium 4.1 3.5-5.3 mEq/L SLN  Chloride 111 96-112 mEq/L SLN  CO2 23 19-32 mEq/L SLN  Glucose 84 70-99 mg/dL SLN  BUN 29 H 9-566-23 mg/dL SLN  Creatinine 2.130.83 0.86-5.780.50-1.10 mg/dL SLN  Bilirubin, Total 0.4 0.2-1.2 mg/dL SLN  Alkaline Phosphatase 71 39-117 U/L SLN  AST/SGOT 16 0-37 U/L SLN  ALT/SGPT 14 0-35 U/L SLN  Total Protein 5.4 L 6.0-8.3 g/dL SLN  Albumin 3.1 L 4.6-9.63.5-5.2 g/dL SLN  Calcium 8.0 L 2.9-52.88.4-10.5   Assessment/Plan 1. Essential hypertension -overall blood pressure has been  well controlled in the last month  2. Constipation, unspecified constipation type -stable at this time  3. Dementia without behavioral disturbance -conts with slow decline however no major changes in the last few months  4. Osteoarthritis, unspecified osteoarthritis type, unspecified site -without complaints of pain at this time.

## 2013-09-11 ENCOUNTER — Encounter: Payer: Self-pay | Admitting: Nurse Practitioner

## 2013-09-11 ENCOUNTER — Non-Acute Institutional Stay (SKILLED_NURSING_FACILITY): Payer: Medicare Other | Admitting: Nurse Practitioner

## 2013-09-11 DIAGNOSIS — F0390 Unspecified dementia without behavioral disturbance: Secondary | ICD-10-CM

## 2013-09-11 DIAGNOSIS — E538 Deficiency of other specified B group vitamins: Secondary | ICD-10-CM

## 2013-09-11 DIAGNOSIS — M1990 Unspecified osteoarthritis, unspecified site: Secondary | ICD-10-CM

## 2013-09-11 DIAGNOSIS — I10 Essential (primary) hypertension: Secondary | ICD-10-CM

## 2013-09-11 DIAGNOSIS — E785 Hyperlipidemia, unspecified: Secondary | ICD-10-CM

## 2013-09-11 NOTE — Progress Notes (Signed)
Patient ID: Katie Gibson, female   DOB: May 20, 1910, 80 y.o.   MRN: [id number]  Location: [facility] Provider:  Candelaria Celeste, NP.  Code Status:  Full  Chief Complaint  Patient presents with  . Medical Management of Chronic Issues    HPI:  Pt is a 80 year old female with a pmh of hyperlipidemia, anemia, anxiety, alzheimer's, htn, OA and constipation. pt is a long term resident of [facility] seen today for a routine follow up on chronic conditions. Pt has been doing well and is without changes in the last month. Daughter at bedside, given updates and discussed plan of care.  Review of Systems:  Unable to obtain Medications: Patient's Medications  New Prescriptions   No medications on file  Previous Medications   ACETAMINOPHEN (TYLENOL) 650 MG CR TABLET    Take 650 mg by mouth every 4 (four) hours as needed for pain.   AMLODIPINE (NORVASC) 5 MG TABLET    Take 5 mg by mouth daily. For HTN   ASPIRIN EC 81 MG TABLET    Take 81 mg by mouth daily.   BISACODYL (DULCOLAX) 10 MG SUPPOSITORY    Place 10 mg rectally daily as needed. For constipation if mom does not work   CYANOCOBALAMIN (VITAMIN B 12 PO)    Take 1,000 mg by mouth daily. For B-12 deficiency   DONEPEZIL (ARICEPT) 10 MG TABLET    Take 10 mg by mouth at bedtime. For dementia   MAGNESIUM HYDROXIDE (MILK OF MAGNESIA) 400 MG/5ML SUSPENSION    Take 30 mLs by mouth daily as needed. For constipation   MEMANTINE HCL ER (NAMENDA XR) 28 MG CP24    Take 1 capsule by mouth daily. For dementia   SENNA-DOCUSATE (SENOKOT-S) 8.6-50 MG PER TABLET    Take 2 tablets by mouth daily. For constipation   SODIUM PHOSPHATES (RA SALINE ENEMA) 19-7 GM/118ML ENEM    Place 1 enema rectally daily as needed. For constipation when mom, and bisacodyl supp does not work   TRAMADOL (ULTRAM) 50 MG TABLET    Take 1 tablet by mouth every 8 hours as needed for pain  Modified Medications   No medications on file  Discontinued Medications   No medications on file     Physical Exam: Filed Vitals:   09/11/13 1118  BP: 132/58  Pulse: 74  Temp: 98.9 F (37.2 C)  Resp: 18  Weight: 131 lb 8 oz (59.648 kg)   Physical Exam  Vitals reviewed. Constitutional: She is well-developed, well-nourished, and in no distress.  HENT:  Head: Normocephalic and atraumatic.  Eyes: Conjunctivae and EOM are normal. Pupils are equal, round, and reactive to light.  Neck: Normal range of motion. Neck supple.  Cardiovascular: Normal rate, regular rhythm and normal heart sounds.   Pulmonary/Chest: Effort normal and breath sounds normal. No respiratory distress.  Abdominal: Soft. Bowel sounds are normal. She exhibits no distension. There is no tenderness.  Musculoskeletal: Normal range of motion. She exhibits no edema and no tenderness.  Neurological: She is alert.  Walks with specialized walker, no recent falls  Skin: Skin is warm and dry. No rash noted. She is not diaphoretic.  Psychiatric: Mood and affect normal. She exhibits abnormal recent memory.     Labs reviewed: Basic Metabolic Panel:  Recent Labs  40/98/11 1956 02/10/13 0130 02/11/13 0508 05/27/13  NA 137  --  143 141  K 5.1  --  4.0 4.1  CL 104  --  111  --  CO2 25  --  25  --   GLUCOSE 91  --  91  --   BUN 26*  --  22 29*  CREATININE 1.01 1.05 0.88 0.8  CALCIUM 8.5  --  8.4  --     Liver Function Tests:  Recent Labs  02/09/13 1956 05/27/13  AST 20 16  ALT 10 14  ALKPHOS 88 71  BILITOT 0.2*  --   PROT 6.4  --   ALBUMIN 3.0*  --     CBC:  Recent Labs  02/09/13 1956 02/10/13 0130 05/27/13  WBC 6.6 7.7 7.2  NEUTROABS 4.2  --   --   HGB 13.1 12.9 11.2*  HCT 39.9 39.9 34*  MCV 89.1 89.3  --   PLT 211 208 239   Lab Results  Component Value Date   CHOL 179 04/13/2013   HDL 57 04/13/2013   LDLCALC 103 04/13/2013   TRIG 93 04/13/2013   CHOLHDL 3.0 10/04/2011    Assessment/Plan 1. Dementia without behavioral disturbance -advanced but stable, given advanced age and nature of  dementia discussed code status in dept with daughter, unwilling to change status at this time, reports she will make that decision when the time comes.   2. Essential hypertension -stable on norvasc  3. Osteoarthritis, unspecified osteoarthritis type, unspecified site -without reports of pain, conts on scheduled tylenol   4. B12 deficiency -conts on b12 supplements  Greater than 30 mins Time TOTAL:  time greater than 50% of total time spent doing pt counseled and coordination of care regarding plan of care and code status education.

## 2013-10-19 ENCOUNTER — Non-Acute Institutional Stay (SKILLED_NURSING_FACILITY): Payer: Medicare Other | Admitting: Internal Medicine

## 2013-10-19 ENCOUNTER — Encounter: Payer: Self-pay | Admitting: Internal Medicine

## 2013-10-19 DIAGNOSIS — F0390 Unspecified dementia without behavioral disturbance: Secondary | ICD-10-CM

## 2013-10-19 DIAGNOSIS — I6350 Cerebral infarction due to unspecified occlusion or stenosis of unspecified cerebral artery: Secondary | ICD-10-CM

## 2013-10-19 DIAGNOSIS — M159 Polyosteoarthritis, unspecified: Secondary | ICD-10-CM

## 2013-10-19 DIAGNOSIS — E785 Hyperlipidemia, unspecified: Secondary | ICD-10-CM

## 2013-10-19 DIAGNOSIS — I10 Essential (primary) hypertension: Secondary | ICD-10-CM

## 2013-10-19 DIAGNOSIS — I639 Cerebral infarction, unspecified: Secondary | ICD-10-CM

## 2013-10-19 NOTE — Assessment & Plan Note (Signed)
No change - on ASA

## 2013-10-19 NOTE — Progress Notes (Signed)
MRN: [id number] Name: Katie Gibson  Sex: female Age: 80 y.o. DOB: Aug 22, 1910  PSC #: [facility] Facility/Room: 221A Level Of Care: SNF Provider: Merrilee Seashore D Emergency Contacts: Extended Emergency Contact Information Primary Emergency Contact: Deveaux,Nikki Address: [address]          [place] Home Phone: [phone] Mobile Phone: [phone] Relation: Other  Code Status: FULL  Allergies: Review of patient's allergies indicates no known allergies.  Chief Complaint  Patient presents with  . Medical Management of Chronic Issues    HPI: Patient is 80 y.o. female whose major problem is dementia and age 54 being seen for routine issues.  Past Medical History  Diagnosis Date  . Dementia   . DVT (deep venous thrombosis)   . Hypertension   . Depression   . GERD (gastroesophageal reflux disease)   . Coronary artery disease   . Hyperlipidemia   . History of DVT (deep vein thrombosis) 10/02/2011  . Low TSH level 10/03/2011    Past Surgical History  Procedure Laterality Date  . Eye surgery    . Cataract extractions with lens implantation        Medication List       This list is accurate as of: 10/19/13  3:59 PM.  Always use your most recent med list.               acetaminophen 650 MG CR tablet  Commonly known as:  TYLENOL  Take 650 mg by mouth every 4 (four) hours as needed for pain.     amLODipine 5 MG tablet  Commonly known as:  NORVASC  Take 5 mg by mouth daily. For HTN     aspirin EC 81 MG tablet  Take 81 mg by mouth daily.     bisacodyl 10 MG suppository  Commonly known as:  DULCOLAX  Place 10 mg rectally daily as needed. For constipation if mom does not work     donepezil 10 MG tablet  Commonly known as:  ARICEPT  Take 10 mg by mouth at bedtime. For dementia     magnesium hydroxide 400 MG/5ML suspension  Commonly known as:  MILK OF MAGNESIA  Take 30 mLs by mouth daily as needed. For  constipation     NAMENDA XR 28 MG Cp24  Generic drug:  Memantine HCl ER  Take 1 capsule by mouth daily. For dementia     RA SALINE ENEMA 19-7 GM/118ML Enem  Place 1 enema rectally daily as needed. For constipation when mom, and bisacodyl supp does not work     senna-docusate 8.6-50 MG per tablet  Commonly known as:  Senokot-S  Take 2 tablets by mouth daily. For constipation     traMADol 50 MG tablet  Commonly known as:  ULTRAM  Take 1 tablet by mouth every 8 hours as needed for pain     VITAMIN B 12 PO  Take 1,000 mg by mouth daily. For B-12 deficiency        No orders of the defined types were placed in this encounter.    Immunization History  Administered Date(s) Administered  . Influenza-Unspecified 11/20/2012  . PPD Test 12/01/2012  . Pneumococcal-Unspecified 09/04/2005    History  Substance Use Topics  . Smoking status: Never Smoker   . Smokeless tobacco: Not on file  . Alcohol Use: No    Review of Systems   --UTO, nurse voice no concerns    Filed Vitals:  10/19/13 1550  BP: 156/70  Pulse: 71  Temp: 97.4 F (36.3 C)  Resp: 20    Physical Exam  GENERAL APPEARANCE: Alert, mod, conversant. Appropriately groomed. No acute distress  SKIN: No diaphoresis rash HEENT: Unremarkable RESPIRATORY: Breathing is even, unlabored. Lung sounds are clear   CARDIOVASCULAR: Heart RRR no murmurs, rubs or gallops. No peripheral edema  GASTROINTESTINAL: Abdomen is soft, non-tender, not distended w/ normal bowel sounds.  GENITOURINARY: Bladder non tender, not distended  MUSCULOSKELETAL: No abnormal joints or musculature NEUROLOGIC: Cranial nerves 2-12 grossly intact.  PSYCHIATRIC: Mood and affect appropriate to situation, no behavioral issues  Patient Active Problem List   Diagnosis Date Noted  . Hyperlipidemia   . Osteoarthritis 12/23/2012  . Dementia without behavioral disturbance 11/25/2012  . Constipation 11/09/2012  . CVA (cerebral infarction) 10/03/2011  .  Hypertension 10/02/2011  . B12 deficiency 10/02/2011    CBC    Component Value Date/Time   WBC 7.2 05/27/2013   WBC 7.7 02/10/2013 0130   RBC 4.47 02/10/2013 0130   HGB 11.2* 05/27/2013   HCT 34* 05/27/2013   PLT 239 05/27/2013   MCV 89.3 02/10/2013 0130   LYMPHSABS 1.9 02/09/2013 1956   MONOABS 0.5 02/09/2013 1956   EOSABS 0.1 02/09/2013 1956   BASOSABS 0.0 02/09/2013 1956    CMP     Component Value Date/Time   NA 141 05/27/2013   NA 143 02/11/2013 0508   K 4.1 05/27/2013   CL 111 02/11/2013 0508   CO2 25 02/11/2013 0508   GLUCOSE 91 02/11/2013 0508   BUN 29* 05/27/2013   BUN 22 02/11/2013 0508   CREATININE 0.8 05/27/2013   CREATININE 0.88 02/11/2013 0508   CALCIUM 8.4 02/11/2013 0508   PROT 6.4 02/09/2013 1956   ALBUMIN 3.0* 02/09/2013 1956   AST 16 05/27/2013   ALT 14 05/27/2013   ALKPHOS 71 05/27/2013   BILITOT 0.2* 02/09/2013 1956   GFRNONAA 52* 02/11/2013 0508   GFRAA 60* 02/11/2013 0508    Assessment and Plan  Hypertension Permissive on norvasc  which is fine for 101  CVA (cerebral infarction) No change - on ASA  Dementia without behavioral disturbance Continue namenda and aricept  Osteoarthritis Has tramadol prn   Hyperlipidemia Risk is age, CVA prior, HTN- LDL 103, HDL 57 on no meds - very acceptable    Margit Hanks, MD

## 2013-10-19 NOTE — Assessment & Plan Note (Signed)
Has tramadol prn

## 2013-10-19 NOTE — Assessment & Plan Note (Signed)
Permissive on norvasc  which is fine for 101

## 2013-10-19 NOTE — Assessment & Plan Note (Signed)
Continue namenda and aricept 

## 2013-10-19 NOTE — Assessment & Plan Note (Addendum)
Risk is age, CVA prior, HTN- LDL 103, HDL 57 on no meds - very acceptable

## 2013-11-16 ENCOUNTER — Encounter: Payer: Self-pay | Admitting: Internal Medicine

## 2013-11-16 ENCOUNTER — Non-Acute Institutional Stay (SKILLED_NURSING_FACILITY): Payer: Medicare Other | Admitting: Internal Medicine

## 2013-11-16 DIAGNOSIS — E538 Deficiency of other specified B group vitamins: Secondary | ICD-10-CM

## 2013-11-16 DIAGNOSIS — I10 Essential (primary) hypertension: Secondary | ICD-10-CM

## 2013-11-16 DIAGNOSIS — E785 Hyperlipidemia, unspecified: Secondary | ICD-10-CM

## 2013-11-16 DIAGNOSIS — F0390 Unspecified dementia without behavioral disturbance: Secondary | ICD-10-CM

## 2013-11-16 NOTE — Assessment & Plan Note (Signed)
No changes, cont namenda and aricept

## 2013-11-16 NOTE — Assessment & Plan Note (Signed)
Controlled on norvasc 5 mg 

## 2013-11-16 NOTE — Assessment & Plan Note (Signed)
Continue ASa

## 2013-11-16 NOTE — Assessment & Plan Note (Signed)
Order level with next lab 11/23/2013

## 2013-11-16 NOTE — Assessment & Plan Note (Signed)
FLP yearly; no new labs, on no meds, no change

## 2013-11-16 NOTE — Progress Notes (Signed)
MRN: [id number] Name: Katie Gibson  Sex: female Age: 80 y.o. DOB: Jun 11, 1910  [facility] #: [facility] Facility/Room: 221A Level Of Care: SNF Provider: Merrilee Seashore D Emergency Contacts: Extended Emergency Contact Information Primary Emergency Contact: Deveaux,Nikki Address: [address]          [place] Home Phone: [phone] Mobile Phone: [phone] Relation: Other  Code Status: FULL  Allergies: Review of patient's allergies indicates no known allergies.  Chief Complaint  Patient presents with  . Medical Management of Chronic Issues    HPI: Patient is 80 y.o. female who is being seen for routines issues who has had no problems.  Past Medical History  Diagnosis Date  . Dementia   . DVT (deep venous thrombosis)   . Hypertension   . Depression   . GERD (gastroesophageal reflux disease)   . Coronary artery disease   . Hyperlipidemia   . History of DVT (deep vein thrombosis) 10/02/2011  . Low TSH level 10/03/2011    Past Surgical History  Procedure Laterality Date  . Eye surgery    . Cataract extractions with lens implantation        Medication List       This list is accurate as of: 11/16/13 11:59 PM.  Always use your most recent med list.               acetaminophen 650 MG CR tablet  Commonly known as:  TYLENOL  Take 650 mg by mouth every 4 (four) hours as needed for pain.     amLODipine 5 MG tablet  Commonly known as:  NORVASC  Take 5 mg by mouth daily. For HTN     aspirin EC 81 MG tablet  Take 81 mg by mouth daily.     bisacodyl 10 MG suppository  Commonly known as:  DULCOLAX  Place 10 mg rectally daily as needed. For constipation if mom does not work     donepezil 10 MG tablet  Commonly known as:  ARICEPT  Take 10 mg by mouth at bedtime. For dementia     magnesium hydroxide 400 MG/5ML suspension  Commonly known as:  MILK OF MAGNESIA  Take 30 mLs by mouth daily as needed. For constipation      NAMENDA XR 28 MG Cp24  Generic drug:  Memantine HCl ER  Take 1 capsule by mouth daily. For dementia     RA SALINE ENEMA 19-7 GM/118ML Enem  Place 1 enema rectally daily as needed. For constipation when mom, and bisacodyl supp does not work     senna-docusate 8.6-50 MG per tablet  Commonly known as:  Senokot-S  Take 2 tablets by mouth daily. For constipation     traMADol 50 MG tablet  Commonly known as:  ULTRAM  Take 1 tablet by mouth every 8 hours as needed for pain     VITAMIN B 12 PO  Take 1,000 mg by mouth daily. For B-12 deficiency        No orders of the defined types were placed in this encounter.    Immunization History  Administered Date(s) Administered  . Influenza-Unspecified 11/20/2012  . PPD Test 12/01/2012  . Pneumococcal-Unspecified 09/04/2005    History  Substance Use Topics  . Smoking status: Never Smoker   . Smokeless tobacco: Not on file  . Alcohol Use: No    Review of Systems  DATA OBTAINED: from patient, nurse; no c/o GENERAL:  no fevers, fatigue, appetite changes SKIN:  No itching, rash HEENT: No complaint RESPIRATORY: No cough, wheezing, SOB CARDIAC: No chest pain, palpitations, lower extremity edema  GI: No abdominal pain, No N/V/D or constipation, No heartburn or reflux  GU: No dysuria, frequency or urgency, or incontinence  MUSCULOSKELETAL: No unrelieved bone/joint pain NEUROLOGIC: No headache, dizziness  PSYCHIATRIC: No overt anxiety or sadness  Filed Vitals:   11/16/13 1411  BP: 120/59  Pulse: 59  Temp: 96.8 F (36 C)  Resp: 18    Physical Exam  GENERAL APPEARANCE: Alert, modconversant, No acute distress  SKIN: No diaphoresis rash HEENT: Unremarkable RESPIRATORY: Breathing is even, unlabored. Lung sounds are clear   CARDIOVASCULAR: Heart RRR no murmurs, rubs or gallops. No peripheral edema  GASTROINTESTINAL: Abdomen is soft, non-tender, not distended w/ normal bowel sounds.  GENITOURINARY: Bladder non tender, not  distended  MUSCULOSKELETAL: No abnormal joints or musculature NEUROLOGIC: Cranial nerves 2-12 grossly intact PSYCHIATRIC: Mood and affect appropriate to situation, no behavioral issues  Patient Active Problem List   Diagnosis Date Noted  . Hyperlipidemia   . Osteoarthritis 12/23/2012  . Dementia without behavioral disturbance 11/25/2012  . Constipation 11/09/2012  . CVA (cerebral infarction) 10/03/2011  . Hypertension 10/02/2011  . B12 deficiency 10/02/2011    CBC    Component Value Date/Time   WBC 7.2 05/27/2013   WBC 7.7 02/10/2013 0130   RBC 4.47 02/10/2013 0130   HGB 11.2* 05/27/2013   HCT 34* 05/27/2013   PLT 239 05/27/2013   MCV 89.3 02/10/2013 0130   LYMPHSABS 1.9 02/09/2013 1956   MONOABS 0.5 02/09/2013 1956   EOSABS 0.1 02/09/2013 1956   BASOSABS 0.0 02/09/2013 1956    CMP     Component Value Date/Time   NA 141 05/27/2013   NA 143 02/11/2013 0508   K 4.1 05/27/2013   CL 111 02/11/2013 0508   CO2 25 02/11/2013 0508   GLUCOSE 91 02/11/2013 0508   BUN 29* 05/27/2013   BUN 22 02/11/2013 0508   CREATININE 0.8 05/27/2013   CREATININE 0.88 02/11/2013 0508   CALCIUM 8.4 02/11/2013 0508   PROT 6.4 02/09/2013 1956   ALBUMIN 3.0* 02/09/2013 1956   AST 16 05/27/2013   ALT 14 05/27/2013   ALKPHOS 71 05/27/2013   BILITOT 0.2* 02/09/2013 1956   GFRNONAA 52* 02/11/2013 0508   GFRAA 60* 02/11/2013 0508    Assessment and Plan  Hypertension Controlled on norvasc 5 mg  Hyperlipidemia FLP yearly; no new labs, on no meds, no change  B12 deficiency Order level with next lab 11/23/2013  CVA (cerebral infarction) Continue ASa  Dementia without behavioral disturbance No changes, cont namenda and aricept    Margit Hanks, MD

## 2014-01-04 ENCOUNTER — Non-Acute Institutional Stay (SKILLED_NURSING_FACILITY): Payer: Medicare Other | Admitting: Internal Medicine

## 2014-01-04 DIAGNOSIS — F0390 Unspecified dementia without behavioral disturbance: Secondary | ICD-10-CM

## 2014-01-04 DIAGNOSIS — D519 Vitamin B12 deficiency anemia, unspecified: Secondary | ICD-10-CM

## 2014-01-04 DIAGNOSIS — I10 Essential (primary) hypertension: Secondary | ICD-10-CM

## 2014-01-04 DIAGNOSIS — E785 Hyperlipidemia, unspecified: Secondary | ICD-10-CM

## 2014-01-04 DIAGNOSIS — E538 Deficiency of other specified B group vitamins: Secondary | ICD-10-CM

## 2014-01-04 DIAGNOSIS — I639 Cerebral infarction, unspecified: Secondary | ICD-10-CM

## 2014-01-04 NOTE — Progress Notes (Signed)
MRN: [id number] Name: Ernestina PennaVictoria E Lepkowski  Sex: female Age: [age] y.o. DOB: 06-Dec-1910  [facility] Facility/Room: [facility]A Level Of Care: SNF Provider: Merrilee SeashoreALEXANDER, Myriah Boggus D Emergency Contacts: Extended Emergency Contact Information Primary Emergency Contact: Deveaux,Nikki Address: [address]          [place] Home Phone: [phone] Mobile Phone: [phone] Relation: Other  Code Status: FULL  Allergies: Review of patient's allergies indicates no known allergies.  Chief Complaint  Patient presents with  . Medical Management of Chronic Issues    HPI: Patient is [age] y.o. female who is being seen for routine issues.  Past Medical History  Diagnosis Date  . Dementia   . DVT (deep venous thrombosis)   . Hypertension   . Depression   . GERD (gastroesophageal reflux disease)   . Coronary artery disease   . Hyperlipidemia   . History of DVT (deep vein thrombosis) 10/02/2011  . Low TSH level 10/03/2011  . Anemia     Past Surgical History  Procedure Laterality Date  . Eye surgery    . Cataract extractions with lens implantation        Medication List       This list is accurate as of: 01/04/14 11:59 PM.  Always use your most recent med list.               acetaminophen 650 MG CR tablet  Commonly known as:  TYLENOL  Take 650 mg by mouth every 4 (four) hours as needed for pain.     amLODipine 5 MG tablet  Commonly known as:  NORVASC  Take 5 mg by mouth daily. For HTN     aspirin EC 81 MG tablet  Take 81 mg by mouth daily.     bisacodyl 10 MG suppository  Commonly known as:  DULCOLAX  Place 10 mg rectally daily as needed. For constipation if mom does not work     donepezil 10 MG tablet  Commonly known as:  ARICEPT  Take 10 mg by mouth at bedtime. For dementia     magnesium hydroxide 400 MG/5ML suspension  Commonly known as:  MILK OF MAGNESIA  Take 30 mLs by mouth daily as needed. For constipation     NAMENDA  XR 28 MG Cp24  Generic drug:  Memantine HCl ER  Take 1 capsule by mouth daily. For dementia     RA SALINE ENEMA 19-7 GM/118ML Enem  Place 1 enema rectally daily as needed. For constipation when mom, and bisacodyl supp does not work     senna-docusate 8.6-50 MG per tablet  Commonly known as:  Senokot-S  Take 2 tablets by mouth daily. For constipation     traMADol 50 MG tablet  Commonly known as:  ULTRAM  Take 1 tablet by mouth every 8 hours as needed for pain        No orders of the defined types were placed in this encounter.    Immunization History  Administered Date(s) Administered  . Influenza-Unspecified 11/20/2012  . PPD Test 12/01/2012  . Pneumococcal-Unspecified 09/04/2005    History  Substance Use Topics  . Smoking status: Never Smoker   . Smokeless tobacco: Not on file  . Alcohol Use: No    Review of Systems  DATA OBTAINED: from patient, nurse; no c/o, no concerns GENERAL:  no fevers, fatigue, appetite changes SKIN: No itching, rash HEENT: No complaint RESPIRATORY: No cough, wheezing, SOB CARDIAC: No chest pain, palpitations, lower  extremity edema  GI: No abdominal pain, No N/V/D or constipation, No heartburn or reflux  GU: No dysuria, frequency or urgency, or incontinence  MUSCULOSKELETAL: No unrelieved bone/joint pain NEUROLOGIC: No headache, dizziness  PSYCHIATRIC: No overt anxiety or sadness  Filed Vitals:   01/04/14 1536  BP: 120/62  Pulse: 84  Temp: 97.4 F (36.3 C)  Resp: 18    Physical Exam  GENERAL APPEARANCE: Alert,min conversant, No acute distress  SKIN: No diaphoresis rash HEENT: Unremarkable RESPIRATORY: Breathing is even, unlabored. Lung sounds are clear   CARDIOVASCULAR: Heart RRR no murmurs, rubs or gallops. No peripheral edema  GASTROINTESTINAL: Abdomen is soft, non-tender, not distended w/ normal bowel sounds.  GENITOURINARY: Bladder non tender, not distended  MUSCULOSKELETAL: No abnormal joints or musculature NEUROLOGIC:  Cranial nerves 2-12 grossly intact. Moves all extremities PSYCHIATRIC: dementia, no behavioral issues  Patient Active Problem List   Diagnosis Date Noted  . Anemia, B12 deficiency   . Hyperlipidemia   . Osteoarthritis 12/23/2012  . Dementia without behavioral disturbance 11/25/2012  . Constipation 11/09/2012  . CVA (cerebral infarction) 10/03/2011  . Hypertension 10/02/2011  . B12 deficiency 10/02/2011    CBC    Component Value Date/Time   WBC 7.2 05/27/2013   WBC 7.7 02/10/2013 0130   RBC 4.47 02/10/2013 0130   HGB 11.2* 05/27/2013   HCT 34* 05/27/2013   PLT 239 05/27/2013   MCV 89.3 02/10/2013 0130   LYMPHSABS 1.9 02/09/2013 1956   MONOABS 0.5 02/09/2013 1956   EOSABS 0.1 02/09/2013 1956   BASOSABS 0.0 02/09/2013 1956    CMP     Component Value Date/Time   NA 141 05/27/2013   NA 143 02/11/2013 0508   K 4.1 05/27/2013   CL 111 02/11/2013 0508   CO2 25 02/11/2013 0508   GLUCOSE 91 02/11/2013 0508   BUN 29* 05/27/2013   BUN 22 02/11/2013 0508   CREATININE 0.8 05/27/2013   CREATININE 0.88 02/11/2013 0508   CALCIUM 8.4 02/11/2013 0508   PROT 6.4 02/09/2013 1956   ALBUMIN 3.0* 02/09/2013 1956   AST 16 05/27/2013   ALT 14 05/27/2013   ALKPHOS 71 05/27/2013   BILITOT 0.2* 02/09/2013 1956   GFRNONAA 52* 02/11/2013 0508   GFRAA 60* 02/11/2013 0508    Assessment and Plan  Dementia without behavioral disturbance Stable and chronic with no recent large declines, on namenda and aricept  Hypertension  Still controlled on norvasc 5 mg  B12 deficiency Recent B12> 2000; no longer deficient; supplements d/c  CVA (cerebral infarction) Chronic and stable and on ASA as prophylaxis  Hyperlipidemia On no meds;LSL103, HDL 51-very acceptable  Anemia, B12 deficiency Last H/H 10.8/34.5  Stable; B12 has been repelated    Margit HanksALEXANDER, Haeleigh Streiff D, MD

## 2014-01-09 ENCOUNTER — Encounter: Payer: Self-pay | Admitting: Internal Medicine

## 2014-01-09 DIAGNOSIS — D519 Vitamin B12 deficiency anemia, unspecified: Secondary | ICD-10-CM | POA: Insufficient documentation

## 2014-01-09 NOTE — Assessment & Plan Note (Signed)
Still controlled on norvasc 5 mg

## 2014-01-09 NOTE — Assessment & Plan Note (Signed)
Chronic and stable and on ASA as prophylaxis

## 2014-01-09 NOTE — Assessment & Plan Note (Signed)
On no meds;LSL103, HDL 51-very acceptable

## 2014-01-09 NOTE — Assessment & Plan Note (Addendum)
Last H/H 10.8/34.5  Stable; B12 has been repelated

## 2014-01-09 NOTE — Assessment & Plan Note (Signed)
Stable and chronic with no recent large declines, on namenda and aricept

## 2014-01-09 NOTE — Assessment & Plan Note (Addendum)
Recent B12> 2000; no longer deficient; supplements d/c

## 2014-01-26 ENCOUNTER — Non-Acute Institutional Stay (SKILLED_NURSING_FACILITY): Payer: Medicare Other | Admitting: Nurse Practitioner

## 2014-01-26 DIAGNOSIS — R634 Abnormal weight loss: Secondary | ICD-10-CM

## 2014-01-26 DIAGNOSIS — R5383 Other fatigue: Secondary | ICD-10-CM

## 2014-01-26 NOTE — Progress Notes (Signed)
Patient ID: Katie Gibson, female   DOB: November 06, 1910, [age] y.o.   MRN: [id number]    [facility]ursing [facility]   Place of Service: SNF (31)  PCP: Margit HanksALEXANDER, ANNE D, MD  No Known Allergies  Chief Complaint  Patient presents with  . Acute Visit    HPI:  Patient is a [age] y.o. female [facility] at request of nursing for abnormal labs and increased lethargy. Pt with advanced dementia and is a poor historian so unable to give much history. Pt with pmh of hyperlipidemia, anemia, anxiety, alzheimer's, htn, OA and constipation. Pt with hx of lethargy and on no sedating medications. Pt eating and drinking with increased encouragement.   Review of Systems:  Review of Systems  Unable to perform ROS: Dementia    Past Medical History  Diagnosis Date  . Dementia   . DVT (deep venous thrombosis)   . Hypertension   . Depression   . GERD (gastroesophageal reflux disease)   . Coronary artery disease   . Hyperlipidemia   . History of DVT (deep vein thrombosis) 10/02/2011  . Low TSH level 10/03/2011  . Anemia    Past Surgical History  Procedure Laterality Date  . Eye surgery    . Cataract extractions with lens implantation     Social History:   reports that she has never smoked. She does not have any smokeless tobacco history on file. She reports that she does not drink alcohol or use illicit drugs.  Family History  Problem Relation Age of Onset  . Stroke Daughter     Medications: Patient's Medications  New Prescriptions   No medications on file  Previous Medications   ACETAMINOPHEN (TYLENOL) 650 MG CR TABLET    Take 650 mg by mouth every 4 (four) hours as needed for pain.   AMLODIPINE (NORVASC) 5 MG TABLET    Take 5 mg by mouth daily. For HTN   ASPIRIN EC 81 MG TABLET    Take 81 mg by mouth daily.   BISACODYL (DULCOLAX) 10 MG SUPPOSITORY    Place 10 mg rectally daily as needed. For constipation if mom does not work   DONEPEZIL (ARICEPT) 10 MG TABLET    Take 10 mg by mouth at bedtime. For dementia   MAGNESIUM HYDROXIDE (MILK OF MAGNESIA) 400 MG/5ML SUSPENSION    Take 30 mLs by mouth daily as needed. For constipation   MEMANTINE HCL ER (NAMENDA XR) 28 MG CP24    Take 1 capsule by mouth daily. For dementia   SENNA-DOCUSATE (SENOKOT-S) 8.6-50 MG PER TABLET    Take 2 tablets by mouth daily. For constipation   SODIUM PHOSPHATES (RA SALINE ENEMA) 19-7 GM/118ML ENEM    Place 1 enema rectally daily as needed. For constipation when mom, and bisacodyl supp does not work   TRAMADOL (ULTRAM) 50 MG TABLET    Take 1 tablet by mouth every 8 hours as needed for pain  Modified Medications   No medications on file  Discontinued Medications   No medications on file     Physical Exam: Filed Vitals:   01/26/14 1704  BP: 120/80  Pulse: 123  Temp: 97 F (36.1 C)  Resp: 20  Weight: 115 lb (52.164 kg)    Physical Exam  Constitutional: She is oriented to person, place, and time. She appears well-developed. No distress.  HENT:  Head: Normocephalic and atraumatic.  Eyes: Conjunctivae and EOM are normal. Pupils are equal,  round, and reactive to light.  Neck: Normal range of motion. Neck supple.  Cardiovascular: Regular rhythm and normal heart sounds.  Tachycardia present.   Pulmonary/Chest: Effort normal and breath sounds normal. No respiratory distress.  Abdominal: Soft. Bowel sounds are normal.  Musculoskeletal: Normal range of motion. She exhibits no edema or tenderness.  Neurological: She is alert and oriented to person, place, and time.  Skin: Skin is warm and dry. She is not diaphoretic.    Labs reviewed: Basic Metabolic Panel:  Recent Labs  30/86/5712/22/14 1956 02/10/13 0130 02/11/13 0508 05/27/13  NA 137  --  143 141  K 5.1  --  4.0 4.1  CL 104  --  111  --   CO2 25  --  25  --   GLUCOSE 91  --  91  --   BUN 26*  --  22 29*  CREATININE 1.01 1.05 0.88 0.8  CALCIUM 8.5  --  8.4  --    Liver Function  Tests:  Recent Labs  02/09/13 1956 05/27/13  AST 20 16  ALT 10 14  ALKPHOS 88 71  BILITOT 0.2*  --   PROT 6.4  --   ALBUMIN 3.0*  --    No results for input(s): LIPASE, AMYLASE in the last 8760 hours.  Recent Labs  02/10/13 0119  AMMONIA 10*   CBC:  Recent Labs  02/09/13 1956 02/10/13 0130 05/27/13  WBC 6.6 7.7 7.2  NEUTROABS 4.2  --   --   HGB 13.1 12.9 11.2*  HCT 39.9 39.9 34*  MCV 89.1 89.3  --   PLT 211 208 239   TSH:  Recent Labs  02/10/13 0130  TSH 0.478   A1C: Lab Results  Component Value Date   HGBA1C 5.4 10/04/2011   Lipid Panel:  Recent Labs  04/13/13  CHOL 179  HDL 57  LDLCALC 103  TRIG 93    Comprehensive Metabolic Panel    Result: 01/26/2014 3:41 PM   ( Status: P )       Sodium 151   H 135-145 mEq/L SLN   Potassium 3.2   L 3.5-5.3 mEq/L SLN   Chloride 113   H 96-112 mEq/L SLN   CO2 27     19-32 mEq/L SLN   Glucose 84     70-99 mg/dL SLN   BUN 31   H 8-466-23 mg/dL SLN   Creatinine 9.620.66     0.50-1.10 mg/dL SLN   Bilirubin, Total 0.7     0.2-1.2 mg/dL SLN   Alkaline Phosphatase 67     39-117 U/L SLN   AST/SGOT 9     0-37 U/L SLN   ALT/SGPT <8     0-35 U/L SLN   Total Protein 5.3   L 6.0-8.3 g/dL SLN   Albumin 2.8   L 9.5-2.83.5-5.2 g/dL SLN   Calcium 8.4     4.1-32.48.4-10.5 mg/dL SLN   TSH    Result: 40/1/027212/09/2013 3:42 PM   ( Status: P )       TSH 1.095     0.350-4.500 uIU/mL SLN  Assessment/Plan 1. Loss of weight Pt has had a steady decline in weigh over the last few months (was 131 lbs in July and now 115) , however has maintained at 115 lbs this month  RD following and supplements have been added. Pt is 80 year old and at this time it is not necessary to add an appetite stimulant as risk out weight benefit.  2. Other fatigue -Pt has had chronic episodes of increased fatigue and now with abnormal labs,  -Pt sleeping prior to exam however was very responsive during exam. Followed commands. Drank 200 cc water without difficulty -will have staff  push fluids every 4 hours, give 1/2 NS 500 cc via clysis   3. Hypokalemia To give 40 meq KCL now and repeat 40 meq in the am

## 2014-01-29 ENCOUNTER — Non-Acute Institutional Stay (SKILLED_NURSING_FACILITY): Payer: Medicare Other | Admitting: Nurse Practitioner

## 2014-01-29 DIAGNOSIS — E876 Hypokalemia: Secondary | ICD-10-CM

## 2014-01-29 DIAGNOSIS — R5383 Other fatigue: Secondary | ICD-10-CM

## 2014-01-29 DIAGNOSIS — E870 Hyperosmolality and hypernatremia: Secondary | ICD-10-CM

## 2014-01-29 DIAGNOSIS — R634 Abnormal weight loss: Secondary | ICD-10-CM

## 2014-01-29 NOTE — Progress Notes (Signed)
Patient ID: Katie Gibson, female   DOB: 07/21/1910, 80 y.o.   MRN: [id number]    Nursing Home Location:  [facility]   Place of Service: SNF (31)  PCP: Margit Hanks, MD  No Known Allergies  Chief Complaint  Patient presents with  . Follow-up    HPI:  Patient is a 80 y.o. female seen today at [facility] for follow up on hypernatremia . Pt with advanced dementia and is a poor historian so unable to give much history. Pt with pmh of hyperlipidemia, anemia, anxiety, alzheimer's, htn, OA and constipation. Pt was found to have tachycardia and elevated sodium levels early this week. Was given 1/2 NS and staff pushing fluids. Sodium is now at 150 (from 151). However pt is more responsive up oob and going to dining room for meals. At daughters request she is being fed during meal times. Review of Systems:  Review of Systems  Unable to perform ROS: Dementia    Past Medical History  Diagnosis Date  . Dementia   . DVT (deep venous thrombosis)   . Hypertension   . Depression   . GERD (gastroesophageal reflux disease)   . Coronary artery disease   . Hyperlipidemia   . History of DVT (deep vein thrombosis) 10/02/2011  . Low TSH level 10/03/2011  . Anemia    Past Surgical History  Procedure Laterality Date  . Eye surgery    . Cataract extractions with lens implantation     Social History:   reports that she has never smoked. She does not have any smokeless tobacco history on file. She reports that she does not drink alcohol or use illicit drugs.  Family History  Problem Relation Age of Onset  . Stroke Daughter     Medications: Patient's Medications  New Prescriptions   No medications on file  Previous Medications   ACETAMINOPHEN (TYLENOL) 650 MG CR TABLET    Take 650 mg by mouth every 4 (four) hours as needed for pain.   AMLODIPINE (NORVASC) 5 MG TABLET    Take 5 mg by mouth daily. For HTN   ASPIRIN EC 81 MG TABLET    Take 81 mg by  mouth daily.   BISACODYL (DULCOLAX) 10 MG SUPPOSITORY    Place 10 mg rectally daily as needed. For constipation if mom does not work   DONEPEZIL (ARICEPT) 10 MG TABLET    Take 10 mg by mouth at bedtime. For dementia   MAGNESIUM HYDROXIDE (MILK OF MAGNESIA) 400 MG/5ML SUSPENSION    Take 30 mLs by mouth daily as needed. For constipation   MEMANTINE HCL ER (NAMENDA XR) 28 MG CP24    Take 1 capsule by mouth daily. For dementia   SENNA-DOCUSATE (SENOKOT-S) 8.6-50 MG PER TABLET    Take 2 tablets by mouth daily. For constipation   SODIUM PHOSPHATES (RA SALINE ENEMA) 19-7 GM/118ML ENEM    Place 1 enema rectally daily as needed. For constipation when mom, and bisacodyl supp does not work   TRAMADOL (ULTRAM) 50 MG TABLET    Take 1 tablet by mouth every 8 hours as needed for pain  Modified Medications   No medications on file  Discontinued Medications   No medications on file     Physical Exam: Filed Vitals:   01/29/14 1303  BP: 116/70  Pulse: 74  Temp: 98 F (36.7 C)  Resp: 20    Physical Exam  Constitutional: She is oriented to person, place, and  time. She appears well-developed. No distress.  HENT:  Head: Normocephalic and atraumatic.  Eyes: Conjunctivae and EOM are normal. Pupils are equal, round, and reactive to light.  Neck: Normal range of motion. Neck supple.  Cardiovascular: Normal rate, regular rhythm and normal heart sounds.   Pulmonary/Chest: Effort normal and breath sounds normal. No respiratory distress.  Abdominal: Soft. Bowel sounds are normal.  Musculoskeletal: Normal range of motion. She exhibits no edema or tenderness.  Neurological: She is alert and oriented to person, place, and time.  Skin: Skin is warm and dry. She is not diaphoretic.    Labs reviewed: Basic Metabolic Panel:  Recent Labs  04/54/0912/22/14 1956 02/10/13 0130 02/11/13 0508 05/27/13  NA 137  --  143 141  K 5.1  --  4.0 4.1  CL 104  --  111  --   CO2 25  --  25  --   GLUCOSE 91  --  91  --   BUN  26*  --  22 29*  CREATININE 1.01 1.05 0.88 0.8  CALCIUM 8.5  --  8.4  --    Liver Function Tests:  Recent Labs  02/09/13 1956 05/27/13  AST 20 16  ALT 10 14  ALKPHOS 88 71  BILITOT 0.2*  --   PROT 6.4  --   ALBUMIN 3.0*  --    No results for input(s): LIPASE, AMYLASE in the last 8760 hours.  Recent Labs  02/10/13 0119  AMMONIA 10*   CBC:  Recent Labs  02/09/13 1956 02/10/13 0130 05/27/13  WBC 6.6 7.7 7.2  NEUTROABS 4.2  --   --   HGB 13.1 12.9 11.2*  HCT 39.9 39.9 34*  MCV 89.1 89.3  --   PLT 211 208 239   TSH:  Recent Labs  02/10/13 0130  TSH 0.478   A1C: Lab Results  Component Value Date   HGBA1C 5.4 10/04/2011   Lipid Panel:  Recent Labs  04/13/13  CHOL 179  HDL 57  LDLCALC 103  TRIG 93    Comprehensive Metabolic Panel    Result: 01/26/2014 3:41 PM   ( Status: P )       Sodium 151   H 135-145 mEq/L SLN   Potassium 3.2   L 3.5-5.3 mEq/L SLN   Chloride 113   H 96-112 mEq/L SLN   CO2 27     19-32 mEq/L SLN   Glucose 84     70-99 mg/dL SLN   BUN 31   H 8-116-23 mg/dL SLN   Creatinine 9.140.66     0.50-1.10 mg/dL SLN   Bilirubin, Total 0.7     0.2-1.2 mg/dL SLN   Alkaline Phosphatase 67     39-117 U/L SLN   AST/SGOT 9     0-37 U/L SLN   ALT/SGPT <8     0-35 U/L SLN   Total Protein 5.3   L 6.0-8.3 g/dL SLN   Albumin 2.8   L 7.8-2.93.5-5.2 g/dL SLN   Calcium 8.4     5.6-21.38.4-10.5 mg/dL SLN   TSH    Result: 08/6/578412/09/2013 3:42 PM   ( Status: P )       TSH 1.095     0.350-4.500 uIU/mL SLN CBC NO Diff (Complete Blood Count)    Result: 01/26/2014 5:40 PM   ( Status: F )     C WBC 7.3     4.0-10.5 K/uL SLN   RBC 3.90     3.87-5.11 MIL/uL SLN  Hemoglobin 10.9   L 12.0-15.0 g/dL SLN   Hematocrit 16.134.2   L 36.0-46.0 % SLN   MCV 87.7     78.0-100.0 fL SLN   MCH 27.9     26.0-34.0 pg SLN   MCHC 31.9     30.0-36.0 g/dL SLN   RDW 09.617.1   H 04.5-40.911.5-15.5 % SLN   Platelet Count 282     150-400 K/uL SLN   MPV 11.2   Basic Metabolic Panel    Result: 01/27/2014 11:30 PM   (  Status: F )       Sodium 150   H 135-145 mEq/L SLN   Potassium 3.6     3.5-5.3 mEq/L SLN   Chloride 114   H 96-112 mEq/L SLN   CO2 26     19-32 mEq/L SLN   Glucose 92     70-99 mg/dL SLN   BUN 33   H 8-116-23 mg/dL SLN   Creatinine 9.140.67     0.50-1.10 mg/dL SLN   Calcium 8.7     7.8-29.58.4-10.5 mg/dL Assessment/Plan 1. Loss of weight Again pt with steady decline of weight loss, pt is now being feed by staff  2. Other fatigue -improved  3. Hypokalemia Improved   4. Hypernatremia Staff encouraging water intake and pt has been receptive, she is more alert and tachycardia has improved. Will cont to monitor at this time and follow up bmp on 02/01/14

## 2014-02-03 ENCOUNTER — Non-Acute Institutional Stay (SKILLED_NURSING_FACILITY): Payer: Medicare Other | Admitting: Internal Medicine

## 2014-02-03 DIAGNOSIS — I480 Paroxysmal atrial fibrillation: Secondary | ICD-10-CM

## 2014-02-03 DIAGNOSIS — I10 Essential (primary) hypertension: Secondary | ICD-10-CM

## 2014-02-03 NOTE — Progress Notes (Signed)
MRN: [id number] Name: Katie Gibson  Sex: female Age: [age] y.o. DOB: April 03, 1910  [facility] Facility/Room: 221A  Level Of Care: SNF Provider: Merrilee SeashoreALEXANDER, ANNE D Emergency Contacts: Extended Emergency Contact Information Primary Emergency Contact: Deveaux,Nikki Address: [address]          [place] Home Phone: [phone] Mobile Phone: [phone] Relation: Other  Code Status: FULL  Allergies: Review of patient's allergies indicates no known allergies.  Chief Complaint  Patient presents with  . Acute Visit    HPI: Patient is [age] y.o. female who is being seen for follow up of ATRIAL FIB DX LAST WEEK.  Past Medical History  Diagnosis Date  . Dementia   . DVT (deep venous thrombosis)   . Hypertension   . Depression   . GERD (gastroesophageal reflux disease)   . Coronary artery disease   . Hyperlipidemia   . History of DVT (deep vein thrombosis) 10/02/2011  . Low TSH level 10/03/2011  . Anemia     Past Surgical History  Procedure Laterality Date  . Eye surgery    . Cataract extractions with lens implantation        Medication List       This list is accurate as of: 02/03/14 11:59 PM.  Always use your most recent med list.               acetaminophen 650 MG CR tablet  Commonly known as:  TYLENOL  Take 650 mg by mouth every 4 (four) hours as needed for pain.     amLODipine 5 MG tablet  Commonly known as:  NORVASC  Take 5 mg by mouth daily. For HTN     aspirin EC 81 MG tablet  Take 81 mg by mouth daily.     bisacodyl 10 MG suppository  Commonly known as:  DULCOLAX  Place 10 mg rectally daily as needed. For constipation if mom does not work     donepezil 10 MG tablet  Commonly known as:  ARICEPT  Take 10 mg by mouth at bedtime. For dementia     magnesium hydroxide 400 MG/5ML suspension  Commonly known as:  MILK OF MAGNESIA  Take 30 mLs by mouth daily as needed. For constipation     NAMENDA XR  28 MG Cp24  Generic drug:  Memantine HCl ER  Take 1 capsule by mouth daily. For dementia     RA SALINE ENEMA 19-7 GM/118ML Enem  Place 1 enema rectally daily as needed. For constipation when mom, and bisacodyl supp does not work     senna-docusate 8.6-50 MG per tablet  Commonly known as:  Senokot-S  Take 2 tablets by mouth daily. For constipation     traMADol 50 MG tablet  Commonly known as:  ULTRAM  Take 1 tablet by mouth every 8 hours as needed for pain        No orders of the defined types were placed in this encounter.    Immunization History  Administered Date(s) Administered  . Influenza-Unspecified 11/20/2012  . PPD Test 12/01/2012  . Pneumococcal-Unspecified 09/04/2005    History  Substance Use Topics  . Smoking status: Never Smoker   . Smokeless tobacco: Not on file  . Alcohol Use: No    Review of Systems  DATA OBTAINED: from patient, nurse- acting like normal self GENERAL:  no fevers, fatigue, appetite changes SKIN: No itching, rash HEENT: No complaint RESPIRATORY: No cough, wheezing,no SOB CARDIAC: No  chest pain, palpitations, lower extremity edema  GI: No abdominal pain, No N/V/D or constipation, No heartburn or reflux  GU: No dysuria, frequency or urgency, or incontinence  MUSCULOSKELETAL: No unrelieved bone/joint pain NEUROLOGIC: No headache, dizziness  PSYCHIATRIC: No overt anxiety or sadness  Filed Vitals:   02/03/14 1759  BP: 116/77  Pulse: 143  Temp: 96.4 F (35.8 C)  Resp: 17    Physical Exam  GENERAL APPEARANCE: Alert, minconversant, No acute distress  SKIN: No diaphoresis rash HEENT: Unremarkable RESPIRATORY: Breathing is even, unlabored. Lung sounds are clear   CARDIOVASCULAR: Heart RRR no murmurs, rubs or gallops. No peripheral edema  GASTROINTESTINAL: Abdomen is soft, non-tender, not distended w/ normal bowel sounds.  GENITOURINARY: Bladder non tender, not distended  MUSCULOSKELETAL: No abnormal joints or  musculature NEUROLOGIC: Cranial nerves 2-12 grossly intact PSYCHIATRIC: dementia, not profound, no behavioral issues  Patient Active Problem List   Diagnosis Date Noted  . Atrial fibrillation 02/04/2014  . Anemia, B12 deficiency   . Hyperlipidemia   . Osteoarthritis 12/23/2012  . Dementia without behavioral disturbance 11/25/2012  . Constipation 11/09/2012  . CVA (cerebral infarction) 10/03/2011  . Hypertension 10/02/2011  . B12 deficiency 10/02/2011    CBC    Component Value Date/Time   WBC 7.2 05/27/2013   WBC 7.7 02/10/2013 0130   RBC 4.47 02/10/2013 0130   HGB 11.2* 05/27/2013   HCT 34* 05/27/2013   PLT 239 05/27/2013   MCV 89.3 02/10/2013 0130   LYMPHSABS 1.9 02/09/2013 1956   MONOABS 0.5 02/09/2013 1956   EOSABS 0.1 02/09/2013 1956   BASOSABS 0.0 02/09/2013 1956    CMP     Component Value Date/Time   NA 141 05/27/2013   NA 143 02/11/2013 0508   K 4.1 05/27/2013   CL 111 02/11/2013 0508   CO2 25 02/11/2013 0508   GLUCOSE 91 02/11/2013 0508   BUN 29* 05/27/2013   BUN 22 02/11/2013 0508   CREATININE 0.8 05/27/2013   CREATININE 0.88 02/11/2013 0508   CALCIUM 8.4 02/11/2013 0508   PROT 6.4 02/09/2013 1956   ALBUMIN 3.0* 02/09/2013 1956   AST 16 05/27/2013   ALT 14 05/27/2013   ALKPHOS 71 05/27/2013   BILITOT 0.2* 02/09/2013 1956   GFRNONAA 52* 02/11/2013 0508   GFRAA 60* 02/11/2013 0508    Assessment and Plan  Atrial fibrillation New onset a week ago per exam and ECG and pt was started on metoprolol 25 mg BID and xarelto 20 mg daily. On recheck today pt is without symptoms but HR was 143 yesterday and in the 120's per me today and pt is in a fib although was reported to be in NSR within the past week per NP. Will increase metoprolol to 37.5 BID.Do not see CBC and BMP ordered so have ordered again. Needs a TSH also. TSH was decreased several years ago with a nl T4 and T3.    Margit HanksALEXANDER, ANNE D, MD

## 2014-02-04 ENCOUNTER — Encounter: Payer: Self-pay | Admitting: Internal Medicine

## 2014-02-04 DIAGNOSIS — I4891 Unspecified atrial fibrillation: Secondary | ICD-10-CM | POA: Insufficient documentation

## 2014-02-04 NOTE — Assessment & Plan Note (Signed)
Good control but can drop norvasc as metoptolol is increased if we need to

## 2014-02-04 NOTE — Assessment & Plan Note (Addendum)
New onset a week ago per exam and ECG and pt was started on metoprolol 25 mg BID and xarelto 20 mg daily. On recheck today pt is without symptoms but HR was 143 yesterday and in the 120's per me today and pt is in a fib although was reported to be in NSR within the past week per NP. Will increase metoprolol to 37.5 BID.Do not see CBC and BMP ordered so have ordered again. Needs a TSH also. TSH was decreased several years ago with a nl T4 and T3.

## 2014-02-19 DEATH — deceased
